# Patient Record
Sex: Male | Born: 1965 | Race: White | Hispanic: No | Marital: Married | Smoking: Former smoker
Health system: Southern US, Community
[De-identification: ages and names within clinical notes are randomized; demographics above are authoritative.]

## PROBLEM LIST (undated history)

## (undated) DIAGNOSIS — E079 Disorder of thyroid, unspecified: Secondary | ICD-10-CM

## (undated) DIAGNOSIS — F419 Anxiety disorder, unspecified: Secondary | ICD-10-CM

## (undated) DIAGNOSIS — K219 Gastro-esophageal reflux disease without esophagitis: Secondary | ICD-10-CM

## (undated) DIAGNOSIS — R5383 Other fatigue: Secondary | ICD-10-CM

## (undated) DIAGNOSIS — F1910 Other psychoactive substance abuse, uncomplicated: Secondary | ICD-10-CM

## (undated) DIAGNOSIS — T7840XA Allergy, unspecified, initial encounter: Secondary | ICD-10-CM

## (undated) HISTORY — DX: Anxiety disorder, unspecified: F41.9

## (undated) HISTORY — DX: Allergy, unspecified, initial encounter: T78.40XA

## (undated) HISTORY — DX: Other psychoactive substance abuse, uncomplicated: F19.10

## (undated) HISTORY — DX: Other fatigue: R53.83

## (undated) HISTORY — DX: Disorder of thyroid, unspecified: E07.9

## (undated) HISTORY — DX: Gastro-esophageal reflux disease without esophagitis: K21.9

---
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
## 2007-10-18 ENCOUNTER — Ambulatory Visit (HOSPITAL_COMMUNITY)
Admission: RE | Admit: 2007-10-18 | Discharge: 2007-10-18 | Payer: Self-pay | Admitting: Physical Medicine and Rehabilitation

## 2010-11-07 ENCOUNTER — Ambulatory Visit (INDEPENDENT_AMBULATORY_CARE_PROVIDER_SITE_OTHER): Payer: 59 | Admitting: General Surgery

## 2010-11-07 ENCOUNTER — Encounter (INDEPENDENT_AMBULATORY_CARE_PROVIDER_SITE_OTHER): Payer: Self-pay | Admitting: General Surgery

## 2010-11-07 VITALS — BP 118/82 | HR 62 | Temp 97.3°F | Ht 75.0 in | Wt 212.1 lb

## 2010-11-07 DIAGNOSIS — K645 Perianal venous thrombosis: Secondary | ICD-10-CM

## 2010-11-07 MED ORDER — DOCUSATE CALCIUM 240 MG PO CAPS: 240.0000 mg | ORAL_CAPSULE | Freq: Two times a day (BID) | ORAL | Status: AC

## 2010-11-07 NOTE — Patient Instructions (Signed)
Take 20-30grams of fiber/day Return to my clinic if symptoms not resolved in 8 weeks

## 2010-11-07 NOTE — Progress Notes (Signed)
Chief Complaint  Patient presents with  . Other    new pt- possible hemorrhoids    HPI Andrew Blair is a 45 y.o. male.  This patient presents today for evaluation of hemorrhoids. He states that he has a history of hemorrhoid flare up approximately 15 years ago which presented with a one to 2 weeks of swelling and bleeding but his symptoms resolved at that time and hasn't had a problem since. Proximally 3 weeks ago, he began having some left-sided rectal swelling and had some associated pain in the area. He treated this with sitz baths, Preparation H, Tucks wipes, and Advil and his pain resolved but he still has bulging and swelling in the area. He denies any bleeding or difficulty with hygiene. He takes occasional over-the-counter stool softeners but states that his bowels are normal a regular "like clockwork". She denies any history of colonoscopy. HPI  Past Medical History  Diagnosis Date  . Thyroid disease     hypothyroid  . GERD (gastroesophageal reflux disease)   . Fatigue     discomfort from hemorrhoids leading to lack of sleep and fatigue     Past Surgical History  Procedure Date  . Skin surgery 1984    skin graph surgery on left hand     Family History  Problem Relation Age of Onset  . Hypertension Mother   . Kidney disease Father     stones    Social History History  Substance Use Topics  . Smoking status: Current Everyday Smoker -- 0.5 packs/day  . Smokeless tobacco: Not on file  . Alcohol Use: 1.5 oz/week    3 drink(s) per week    No Known Allergies  Current Outpatient Prescriptions  Medication Sig Dispense Refill  . Ibuprofen (ADVIL PO) Take 400 mg by mouth daily.        . Levothyroxine Sodium (SYNTHROID PO) Take 0.25 mg by mouth daily.        Marland Kitchen omeprazole (PRILOSEC) 40 MG capsule Take 40 mg by mouth as needed.        . docusate calcium (SURFAK) 240 MG capsule Take 1 capsule (240 mg total) by mouth 2 (two) times daily.  60 capsule  0    Review of  Systems Review of Systems  Constitutional: Positive for malaise/fatigue. Negative for fever and chills.  HENT: Negative.   Eyes: Negative.   Respiratory: Negative.   Cardiovascular: Negative.   Gastrointestinal: Negative.   Genitourinary: Negative.   Musculoskeletal: Negative.   Skin: Negative.   Neurological: Negative.   Endo/Heme/Allergies: Negative.   Psychiatric/Behavioral: Negative.     Blood pressure 118/82, pulse 62, temperature 97.3 F (36.3 C), height 6\' 3"  (1.905 m), weight 212 lb 2 oz (96.219 kg).  Physical Exam Physical Exam  Constitutional: He is oriented to person, place, and time. He appears well-developed and well-nourished. No distress.  HENT:  Head: Normocephalic and atraumatic.  Mouth/Throat: No oropharyngeal exudate.  Eyes: Conjunctivae and EOM are normal. Pupils are equal, round, and reactive to light. Right eye exhibits no discharge. Left eye exhibits no discharge. No scleral icterus.  Neck: Normal range of motion. Neck supple. No tracheal deviation present.  Cardiovascular: Normal rate, regular rhythm and normal heart sounds.   Respiratory: Effort normal and breath sounds normal. No stridor. No respiratory distress. He has no wheezes.  GI: Soft. Bowel sounds are normal. He exhibits no distension and no mass. There is no tenderness. There is no rebound and no guarding.  Genitourinary:  Digital rectal exam reveals no internal lesions. No masses. He has a thrombosed external hemorrhoid at the left lateral column. There is a purple hue consistent with thrombosed external hemorrhoid. He has mild right-sided external hemorrhoids without evidence of thrombosis. No evidence of bleeding. Nontender exam.  Musculoskeletal: Normal range of motion. He exhibits no edema.  Neurological: He is alert and oriented to person, place, and time.  Skin: Skin is warm and dry. No rash noted. He is not diaphoretic. No erythema. No pallor.  Psychiatric: He has a normal mood and  affect. His behavior is normal. Judgment and thought content normal.      Assessment    Thrombosed external hemorrhoids. No evidence of bleeding. I do think that this is thrombosed hemorrhoid and is in the resolving stages. No evidence of internal masses or tumors.    Plan    I discussed with him the management of hemorrhoid disease. I think that conservative management for him would be best given his limited symptoms otherwise. He has had only one other episode of hemorrhoid problems approximately 15 years ago and nothing since. I did recommend increased fiber intake to 20-30 g of fiber per day and increase water intake and continue his conservative management as he is articulate with over-the-counter topical treatments. I also prescribed a stool softener which he can take as needed if any constipation. This should resolve in the next few weeks and I do not think will require any incision and drainage since he is not having any discomfort currently. I recommended that he follow up with me in approximately 6-8 weeks if he still has any symptoms. At the time we will consider formal hemorrhoidectomy if needed.       Lodema Pilot DAVID 11/07/2010, 6:11 PM

## 2011-02-20 DIAGNOSIS — J3089 Other allergic rhinitis: Secondary | ICD-10-CM | POA: Insufficient documentation

## 2011-10-19 ENCOUNTER — Other Ambulatory Visit: Payer: Self-pay | Admitting: Orthopedic Surgery

## 2011-10-19 ENCOUNTER — Ambulatory Visit
Admission: RE | Admit: 2011-10-19 | Discharge: 2011-10-19 | Disposition: A | Discharge status: Home / Self Care | Payer: 59 | Source: Ambulatory Visit | Attending: Orthopedic Surgery | Admitting: Orthopedic Surgery

## 2011-10-19 DIAGNOSIS — R52 Pain, unspecified: Secondary | ICD-10-CM

## 2012-07-02 ENCOUNTER — Ambulatory Visit (INDEPENDENT_AMBULATORY_CARE_PROVIDER_SITE_OTHER): Payer: 59 | Admitting: Family Medicine

## 2012-07-02 VITALS — BP 106/68 | HR 87 | Temp 98.1°F | Resp 16

## 2012-07-02 DIAGNOSIS — R3989 Other symptoms and signs involving the genitourinary system: Secondary | ICD-10-CM

## 2012-07-02 DIAGNOSIS — N390 Urinary tract infection, site not specified: Secondary | ICD-10-CM

## 2012-07-02 DIAGNOSIS — N419 Inflammatory disease of prostate, unspecified: Secondary | ICD-10-CM

## 2012-07-02 DIAGNOSIS — N399 Disorder of urinary system, unspecified: Secondary | ICD-10-CM

## 2012-07-02 LAB — POCT CBC
HCT, POC: 44 % (ref 43.5–53.7)
Hemoglobin: 14.2 g/dL (ref 14.1–18.1)
Lymph, poc: 1.3 (ref 0.6–3.4)
MCH, POC: 30.5 pg (ref 27–31.2)
MCHC: 32.3 g/dL (ref 31.8–35.4)
MCV: 94.4 fL (ref 80–97)
POC Granulocyte: 6.5 (ref 2–6.9)
WBC: 8.2 10*3/uL (ref 4.6–10.2)

## 2012-07-02 LAB — POCT URINALYSIS DIPSTICK
Nitrite, UA: POSITIVE
Protein, UA: 100
Urobilinogen, UA: 0.2
pH, UA: 5.5

## 2012-07-02 LAB — POCT UA - MICROSCOPIC ONLY
Casts, Ur, LPF, POC: NEGATIVE
Crystals, Ur, HPF, POC: NEGATIVE
Yeast, UA: POSITIVE

## 2012-07-02 LAB — PSA: PSA: 2.19 ng/mL (ref <=4.00)

## 2012-07-02 MED ORDER — CIPROFLOXACIN HCL 500 MG PO TABS: 500.0000 mg | ORAL_TABLET | Freq: Two times a day (BID) | ORAL | Status: DC

## 2012-07-02 NOTE — Patient Instructions (Addendum)
Drink plenty of fluids, start antibiotic for possible prostate infection, and recheck in 2 days. Return to the clinic or go to the nearest emergency room if any of your symptoms worsen or new symptoms occur. Your should receive a call or letter about your lab results within the next week to 10 days.   Prostatitis The prostate gland is about the size and shape of a walnut. It is located just below your bladder. It produces one of the components of semen, which is made up of sperm and the fluids that help nourish and transport it out from the testicles. Prostatitis is redness, soreness, and swelling (inflammation) of the prostate gland.  There are 3 types of prostatitis:  Acute bacterial prostatitis This is the least common type of prostatitis. It starts quickly and usually leads to a bladder infection. It can occur at any age.  Chronic bacterial prostatitis This is a persistent bacterial infection in the prostate.It usually develops from repeated acute bacterial prostatitis or acute bacterial prostatitis that was not properly treated. It can occur in men of any age but is most common in middle-aged men whose prostate has begun to enlarge.  Chronic prostatitis chronic pelvic pain syndrome This is the most common type of prostatitis. It is inflammation of the prostate gland that is not caused by a bacterial infection. The cause is unknown. CAUSES The cause of acute and chronic bacterial prostatitis is a bacterial infection. The exact cause of chronic prostatitis and chronic pelvic pain syndrome and asymptomatic inflammatory prostatitis is unknown.  SYMPTOMS  Symptoms can vary depending upon the type of prostatitis that exists. There can also be overlap in symptoms. Possible symptoms for each type of prostatitis are listed below. Acute bacterial prostatitis  Painful urination.  Fever or chills.  Muscle or joint pains.  Low back pain.  Low abdominal pain.  Inability to empty bladder  completely.  Sudden urge to urinate.  Frequent urination.  Difficulty starting urine stream.  Weak urine stream.  Discharge from the urethra.  Dribbling after urination.  Rectal pain.  Pain in the testicles, penis, or tip of the penis.  Pain in the space between the anus and scrotum (perineum).  Problems with sexual function.  Painful ejaculation.  Bloody semen. Chronic bacterial prostatitis  The symptoms are similar to those of acute bacterial prostatitis, but they usually are much less severe. Fever, chills, and muscle and joint pain are not associated with chronic bacterial prostatitis. Chronic prostatitis chronic pelvic pain syndrome  Symptoms typically include a dull ache in the scrotum and the perineum. DIAGNOSIS  In order to diagnose prostatitis, your caregiver will ask about your symptoms. If acute or chronic bacterial prostatitis is suspected, a urine sample will be taken and tested (urinalysis). This is to see if there is bacteria in your urine. If the urinalysis result is negative for bacteria, your caregiver may use a finger to feel your prostate (digital rectal exam). This exam helps your caregiver determine if your prostate is swollen and tender. TREATMENT  Treatment for prostatitis depends on the cause. If a bacterial infection is the cause, it can be treated with antibiotic medicine. In cases of chronic bacterial prostatitis, the use of antibiotics for up to 1 month may be necessary. Your caregiver may instruct you to take sitz baths to help relieve pain. A sitz bath is a bath of hot water in which your hips and buttocks are under water. HOME CARE INSTRUCTIONS   Take all medicines as directed by your  caregiver.  Take sitz baths as directed by your caregiver. SEEK MEDICAL CARE IF:   Your symptoms get worse, not better.  You have a fever. SEEK IMMEDIATE MEDICAL CARE IF:   You have chills.  You feel nauseous or vomit.  You feel lightheaded or  faint.  You are unable to urinate.  You have blood or blood clots in your urine. Document Released: 03/02/2000 Document Revised: 05/28/2011 Document Reviewed: 02/05/2011 [facility] Patient Information 2013 [place], [place].

## 2012-07-02 NOTE — Progress Notes (Signed)
Subjective:    Patient ID: Andrew Blair, male    DOB: [birth year], 47 y.o.   MRN: [id number]  HPI Andrew Blair is a 47 y.o. male Here with primary concern is diffuse aches and pains in back and other areas of soreness where previous injuries (did have rib injuries last year and under crawlspace few days prior). Then noticed urgency to urinate yesterday morning, some increased frequency, small amounts.  More sleep last night.  Able to urinate - just smaller amounts and harder to go. No known prior hx of bladder infections or prostate infections.  No known hx of PSA. No fever, n/v, but feels chills some today. Eating and drinking ok today. More constipated past few days - no urge to defecate.   Review of Systems  Constitutional: Positive for chills. Negative for fever.  Gastrointestinal: Negative for nausea, vomiting, abdominal pain, diarrhea, blood in stool and abdominal distention.  Genitourinary: Positive for dysuria, urgency, frequency, flank pain and difficulty urinating. Negative for hematuria, discharge, penile pain and testicular pain.  Musculoskeletal: Positive for myalgias.  Skin: Negative for rash.       Objective:   Physical Exam  Vitals reviewed. Constitutional: He is oriented to person, place, and time. He appears well-developed and well-nourished.  HENT:  Head: Normocephalic and atraumatic.  Eyes: EOM are normal. Pupils are equal, round, and reactive to light.  Neck: No JVD present. Carotid bruit is not present.  Cardiovascular: Normal rate, regular rhythm and normal heart sounds.   No murmur heard. Pulmonary/Chest: Effort normal and breath sounds normal. He has no rales.  Abdominal: Soft. Bowel sounds are normal. There is no tenderness. There is CVA tenderness (r sided slightlly, with paraspinals ttp minimally.  ). No hernia. Hernia confirmed negative in the right inguinal area and confirmed negative in the left inguinal area.  Genitourinary: Testes normal and  penis normal. Right testis shows no swelling and no tenderness. Left testis shows no swelling and no tenderness.  Musculoskeletal: He exhibits no edema.  Lymphadenopathy:       Right: No inguinal adenopathy present.       Left: No inguinal adenopathy present.  Neurological: He is alert and oriented to person, place, and time.  Skin: Skin is warm and dry.  Psychiatric: He has a normal mood and affect.       Results for orders placed in visit on 07/02/12  POCT URINALYSIS DIPSTICK      Result Value Range   Color, UA amber     Clarity, UA cloudy     Glucose, UA neg     Bilirubin, UA neg     Ketones, UA trace     Spec Grav, UA >=1.030     Blood, UA small     pH, UA 5.5     Protein, UA 100     Urobilinogen, UA 0.2     Nitrite, UA pos     Leukocytes, UA small (1+)    POCT UA - MICROSCOPIC ONLY      Result Value Range   WBC, Ur, HPF, POC 4-24     RBC, urine, microscopic 6-18     Bacteria, U Microscopic 2+     Mucus, UA mod     Epithelial cells, urine per micros 0-1     Crystals, Ur, HPF, POC neg     Casts, Ur, LPF, POC neg     Yeast, UA pos    POCT CBC      Result Value  Range   WBC 8.2  4.6 - 10.2 K/uL   Lymph, poc 1.3  0.6 - 3.4   POC LYMPH PERCENT 15.5  10 - 50 %L   MID (cbc) 0.4  0 - 0.9   POC MID % 5.0  0 - 12 %M   POC Granulocyte 6.5  2 - 6.9   Granulocyte percent 79.5  37 - 80 %G   RBC 4.66 (*) 4.69 - 6.13 M/uL   Hemoglobin 14.2  14.1 - 18.1 g/dL   HCT, POC 16.1  09.6 - 53.7 %   MCV 94.4  80 - 97 fL   MCH, POC 30.5  27 - 31.2 pg   MCHC 32.3  31.8 - 35.4 g/dL   RDW, POC 04.5     Platelet Count, POC 216  142 - 424 K/uL   MPV 9.1  0 - 99.8 fL         Assessment & Plan:  [name] is a 47 y.o. male Urine troubles - Plan: POCT urinalysis dipstick, POCT UA - Microscopic Only, POCT CBC, PSA, Urine culture  UTI (urinary tract infection) - Plan: ciprofloxacin (CIPRO) 500 MG tablet  Prostatitis - Plan: ciprofloxacin (CIPRO) 500 MG tablet  UTI - with  pyuria/hematuria, back pain/myalgias and urinary urgency.  R flank pain - ddx includes pyelo, but more likely prostatitis on hx.  Start cipro 500mg  BID - 10 days rx.  check urine cx, push fluids, rtc precautions.  Prostate exam deferred today as likely prostatitis, but may need to check this at follow up. Recheck in 2 days.   Patient Instructions  Drink plenty of fluids, start antibiotic for possible prostate infection, and recheck in 2 days. Return to the clinic or go to the nearest emergency room if any of your symptoms worsen or new symptoms occur. Your should receive a call or letter about your lab results within the next week to 10 days.   Prostatitis The prostate gland is about the size and shape of a walnut. It is located just below your bladder. It produces one of the components of semen, which is made up of sperm and the fluids that help nourish and transport it out from the testicles. Prostatitis is redness, soreness, and swelling (inflammation) of the prostate gland.  There are 3 types of prostatitis:  Acute bacterial prostatitis This is the least common type of prostatitis. It starts quickly and usually leads to a bladder infection. It can occur at any age.  Chronic bacterial prostatitis This is a persistent bacterial infection in the prostate.It usually develops from repeated acute bacterial prostatitis or acute bacterial prostatitis that was not properly treated. It can occur in men of any age but is most common in middle-aged men whose prostate has begun to enlarge.  Chronic prostatitis chronic pelvic pain syndrome This is the most common type of prostatitis. It is inflammation of the prostate gland that is not caused by a bacterial infection. The cause is unknown. CAUSES The cause of acute and chronic bacterial prostatitis is a bacterial infection. The exact cause of chronic prostatitis and chronic pelvic pain syndrome and asymptomatic inflammatory prostatitis is unknown.  SYMPTOMS    Symptoms can vary depending upon the type of prostatitis that exists. There can also be overlap in symptoms. Possible symptoms for each type of prostatitis are listed below. Acute bacterial prostatitis  Painful urination.  Fever or chills.  Muscle or joint pains.  Low back pain.  Low abdominal pain.  Inability to  empty bladder completely.  Sudden urge to urinate.  Frequent urination.  Difficulty starting urine stream.  Weak urine stream.  Discharge from the urethra.  Dribbling after urination.  Rectal pain.  Pain in the testicles, penis, or tip of the penis.  Pain in the space between the anus and scrotum (perineum).  Problems with sexual function.  Painful ejaculation.  Bloody semen. Chronic bacterial prostatitis  The symptoms are similar to those of acute bacterial prostatitis, but they usually are much less severe. Fever, chills, and muscle and joint pain are not associated with chronic bacterial prostatitis. Chronic prostatitis chronic pelvic pain syndrome  Symptoms typically include a dull ache in the scrotum and the perineum. DIAGNOSIS  In order to diagnose prostatitis, your caregiver will ask about your symptoms. If acute or chronic bacterial prostatitis is suspected, a urine sample will be taken and tested (urinalysis). This is to see if there is bacteria in your urine. If the urinalysis result is negative for bacteria, your caregiver may use a finger to feel your prostate (digital rectal exam). This exam helps your caregiver determine if your prostate is swollen and tender. TREATMENT  Treatment for prostatitis depends on the cause. If a bacterial infection is the cause, it can be treated with antibiotic medicine. In cases of chronic bacterial prostatitis, the use of antibiotics for up to 1 month may be necessary. Your caregiver may instruct you to take sitz baths to help relieve pain. A sitz bath is a bath of hot water in which your hips and buttocks are  under water. HOME CARE INSTRUCTIONS   Take all medicines as directed by your caregiver.  Take sitz baths as directed by your caregiver. SEEK MEDICAL CARE IF:   Your symptoms get worse, not better.  You have a fever. SEEK IMMEDIATE MEDICAL CARE IF:   You have chills.  You feel nauseous or vomit.  You feel lightheaded or faint.  You are unable to urinate.  You have blood or blood clots in your urine. Document Released: 03/02/2000 Document Revised: 05/28/2011 Document Reviewed: 02/05/2011 [facility] Patient Information 2013 [place], [place].

## 2012-07-04 LAB — URINE CULTURE

## 2012-12-15 ENCOUNTER — Encounter: Payer: Self-pay | Admitting: Family Medicine

## 2012-12-15 ENCOUNTER — Ambulatory Visit (INDEPENDENT_AMBULATORY_CARE_PROVIDER_SITE_OTHER): Payer: 59 | Admitting: Family Medicine

## 2012-12-15 VITALS — BP 118/80 | HR 71 | Temp 97.8°F | Resp 16 | Ht 73.0 in | Wt 212.0 lb

## 2012-12-15 DIAGNOSIS — K219 Gastro-esophageal reflux disease without esophagitis: Secondary | ICD-10-CM

## 2012-12-15 DIAGNOSIS — H9313 Tinnitus, bilateral: Secondary | ICD-10-CM

## 2012-12-15 DIAGNOSIS — H9319 Tinnitus, unspecified ear: Secondary | ICD-10-CM

## 2012-12-15 DIAGNOSIS — L290 Pruritus ani: Secondary | ICD-10-CM

## 2012-12-15 DIAGNOSIS — Z87440 Personal history of urinary (tract) infections: Secondary | ICD-10-CM

## 2012-12-15 DIAGNOSIS — K220 Achalasia of cardia: Secondary | ICD-10-CM

## 2012-12-15 DIAGNOSIS — B070 Plantar wart: Secondary | ICD-10-CM

## 2012-12-15 DIAGNOSIS — Z0000 Encounter for general adult medical examination without abnormal findings: Secondary | ICD-10-CM

## 2012-12-15 DIAGNOSIS — Z23 Encounter for immunization: Secondary | ICD-10-CM

## 2012-12-15 LAB — POCT URINALYSIS DIPSTICK
Bilirubin, UA: NEGATIVE
Leukocytes, UA: NEGATIVE
Nitrite, UA: NEGATIVE
Protein, UA: NEGATIVE
pH, UA: 5.5

## 2012-12-15 LAB — CBC WITH DIFFERENTIAL/PLATELET
Hemoglobin: 14.3 g/dL (ref 13.0–17.0)
Lymphocytes Relative: 28 % (ref 12–46)
Lymphs Abs: 1.6 10*3/uL (ref 0.7–4.0)
Monocytes Relative: 6 % (ref 3–12)
Neutro Abs: 3.4 10*3/uL (ref 1.7–7.7)
Neutrophils Relative %: 61 % (ref 43–77)
RBC: 4.71 MIL/uL (ref 4.22–5.81)

## 2012-12-15 LAB — COMPREHENSIVE METABOLIC PANEL
Albumin: 4.9 g/dL (ref 3.5–5.2)
CO2: 28 mEq/L (ref 19–32)
Glucose, Bld: 87 mg/dL (ref 70–99)
Potassium: 3.8 mEq/L (ref 3.5–5.3)
Sodium: 133 mEq/L — ABNORMAL LOW (ref 135–145)
Total Protein: 7.4 g/dL (ref 6.0–8.3)

## 2012-12-15 LAB — LIPID PANEL
Cholesterol: 236 mg/dL — ABNORMAL HIGH (ref 0–200)
LDL Cholesterol: 130 mg/dL — ABNORMAL HIGH (ref 0–99)
Triglycerides: 226 mg/dL — ABNORMAL HIGH (ref <150)

## 2012-12-15 LAB — POCT UA - MICROSCOPIC ONLY
Bacteria, U Microscopic: NEGATIVE
Casts, Ur, LPF, POC: NEGATIVE
Yeast, UA: NEGATIVE

## 2012-12-15 NOTE — Progress Notes (Signed)
Subjective:    Patient ID: Andrew Blair, male    DOB: 21-Dec-1965, 47 y.o.   MRN: [id number]  HPI Andrew Blair is a 47 y.o. male  Here for annual exam/physical. Other concerns as per ROS and below. Fasting for 8 hrs.   Last seen 07/02/12 for UTI/suspected prostatitis. Urine culture positive for E coli, sensitive to Cipro as prescribed, and PSA 2.19.  Has not had follow up U/A or PSA.   Episodic anal itching after taking Cipro - notes in bed at times, resolves then returns. Persistent. Tried otc preparation H - no relief.   Dentist: Dr. Lyda Jester - 1 month ago.  Optho: progressive lenses and exam in Spring 2014.  Flu: never taken, as never had flu. Refused today.  Tetanus: over 10 years. Will have today.    Hypothyroidism -synthroid 150mg  QD.  followed by Dr. Sharl Ma. Followed quarterly.   Episode of food getting stuck in November 2012. Ever since - feeling like solid foods - meats, bread feel stuck  if not chewing well. Is able to clear on own.  Belching frequently. Has episodic heartburn - otc omeprazole - as needed. Few times per month, prior QD, improved with diet changes. Last endoscopy about 4 years ago - unknown GI then. No vomiting.   Painful hard spot on L heel for few months. NKI.  Calloused area. Able to walk ok, but sore with weight on it.   Few moles in front and behind ear. No recent changes. No FH of skin cancer. No recent derm eval.    FH: throat cancer or lymphoma in maternal GM. Paternal uncles with MI/CAD - earliest at 76yo. Dad with PTCA at 65yo. No MI.   Past Medical History  Diagnosis Date  . Thyroid disease     hypothyroid  . GERD (gastroesophageal reflux disease)   . Fatigue     discomfort from hemorrhoids leading to lack of sleep and fatigue   . Allergy   . Anxiety   . Substance abuse    Past Surgical History  Procedure Laterality Date  . Skin surgery  1984    skin graph surgery on left hand   . Hand surgery  1984   No Known Allergies Prior  to Admission medications   Medication Sig Start Date End Date Taking? Authorizing Provider  levothyroxine (SYNTHROID, LEVOTHROID) 100 MCG tablet Take 100 mcg by mouth daily.   Yes Historical Provider, MD  ciprofloxacin (CIPRO) 500 MG tablet Take 1 tablet (500 mg total) by mouth 2 (two) times daily. 07/02/12   Shade Flood, MD   History   Social History  . Marital Status: Married    Spouse Name: N/A    Number of Children: N/A  . Years of Education: N/A   Occupational History  . Not on file.   Social History Main Topics  . Smoking status: Current Every Day Smoker -- 0.50 packs/day  . Smokeless tobacco: Not on file  . Alcohol Use: 1.5 oz/week    3 drink(s) per week  . Drug Use: No  . Sexual Activity: Not on file   Other Topics Concern  . Not on file   Social History Narrative   E_Cigarette      Review of Systems 13 point review of systems per patient health survey noted.  Negative other than as indicated on reviewed nursing note.   Chronic L >R tinnitus, since playing bass in band for years. No recent changes.  Hx of neck pain, hand  dysesthesias with DDD in neck, s/p therapy, no surgery. Pain comes and goes at times. Less if remaining active.       Objective:   Physical Exam  Vitals reviewed. Constitutional: He is oriented to person, place, and time. He appears well-developed and well-nourished.  HENT:  Head: Normocephalic and atraumatic.    Right Ear: External ear normal.  Left Ear: External ear normal.  Mouth/Throat: Oropharynx is clear and moist.  Eyes: Conjunctivae and EOM are normal. Pupils are equal, round, and reactive to light.  Neck: Normal range of motion. Neck supple. No thyromegaly present.  Cardiovascular: Normal rate, regular rhythm, normal heart sounds and intact distal pulses.   Pulmonary/Chest: Effort normal and breath sounds normal. No respiratory distress. He has no wheezes.  Abdominal: Soft. He exhibits no distension. There is no tenderness.  Hernia confirmed negative in the right inguinal area and confirmed negative in the left inguinal area.  Genitourinary: Prostate normal.  R perianal area slightly excoriated.   Musculoskeletal: Normal range of motion. He exhibits no edema and no tenderness.  Lymphadenopathy:    He has no cervical adenopathy.  Neurological: He is alert and oriented to person, place, and time. He has normal reflexes.  Skin: Skin is warm and dry.  Psychiatric: He has a normal mood and affect. His behavior is normal.    EKG: sinus bradycardia, nonspecific twaves V1-2. No prior available for comparison.  Results for orders placed in visit on 12/15/12  POCT URINALYSIS DIPSTICK      Result Value Range   Color, UA yellow     Clarity, UA clear     Glucose, UA neg     Bilirubin, UA neg     Ketones, UA neg     Spec Grav, UA >=1.030     Blood, UA neg     pH, UA 5.5     Protein, UA neg     Urobilinogen, UA 0.2     Nitrite, UA neg     Leukocytes, UA Negative    POCT UA - MICROSCOPIC ONLY      Result Value Range   WBC, Ur, HPF, POC 0-1     RBC, urine, microscopic 0-2     Bacteria, U Microscopic neg     Mucus, UA trace     Epithelial cells, urine per micros 0-1     Crystals, Ur, HPF, POC neg     Casts, Ur, LPF, POC neg     Yeast, UA neg         Assessment & Plan:  Andrew Blair is a 47 y.o. male Routine general medical examination at a health care facility - Plan: POCT urinalysis dipstick, POCT UA - Microscopic Only, CBC with Differential, Comprehensive metabolic panel, Lipid panel, PSA, Tdap vaccine greater than or equal to 7yo IM, EKG 12-Lead  GERD (gastroesophageal reflux disease) - Plan: Ambulatory referral to Gastroenterology  Achalasia - Plan: Ambulatory referral to Gastroenterology  Hx: UTI (urinary tract infection)  Plantar wart  Tinnitus of both ears  Anal itching  Annual Exam  - anticipatory guidance below, labs as above. Declined flu vaccination. Tdap given.   Plantar wart - L  heel - try otc wart remover, corn/callous pad, then recheck if not improving in next 1-2 months  Preauricular folliculitis vs. Ingrown hairs on face.  No suspicious nevi, but if not resolving - recheck for eval.   Hypothyroidism - followed by endocrine.   Pruritus ani - doubt from prior abx's.  H/o  as below. Doubt pinworms as no other members of family with sx's, but consider tx if not improving.   GERD, with intermittent achalasia sx's. - refer to GI for eval, possible EGD, restart otc omeprazole QD in the meantime.    Tinnitus - chronic/longstanding. Some subjective hearing loss - discussed ENT eval, declined at present.   Intermittent neck pain, hx of DDD - stable currently, discussed to rtc to eval further if recurs/flairs.    Patient Instructions  Omeprazole every day for now, we will refer you to gastroenterologist to discuss this further. Try compound W over the counter for foot wart, recheck in next month if not improving.  Also return in next month if tips below do not help anal itching. The bumps on the face to appear to be ingrown hairs. If more redness, discharge or not resolving - return for recheck.  Let me know if you would like to be evaluated with hearing test or Ear nose and Throat physician for the ringing in the ears.  You should receive a call or letter about your lab results within the next week to 10 days.   Return to the clinic or go to the nearest emergency room if any of your symptoms worsen or new symptoms occur.   Anal Itching Itching around the anus is a common problem. It is usually not dangerous. It often is caused by skin irritation from stool, moisture, soaps, or clothing. Other causes are pinworms, especially if the itching is worse at night. In adults, the itching may be due to hemorrhoids. In some cases, the cause is unknown. Itching usually can be controlled by keeping the anal area clean and dry. CAUSES   Loose or sticky stool from diarrhea or rectal  leakage.   Hemorrhoids. They allow stool to stick to the rectal area.   Certain foods. Be sure to discuss your diet with your caregiver.   Dry skin or skin diseases.   Infections such as a local yeast infection or certain sexually transmitted diseases (STDs).   Worms (parasites).   Diseases of the anus. These include abscesses, fissures, fistulas or cancer.   Sometimes a cause cannot be found.  DIAGNOSIS   Your caregiver will take your history and examine you. A careful exam of the anus is important. Your caregiver will inspect the outer area of your anus and will do a rectal exam.   Sometimes your caregiver will need to look inside the anus. This is a simple procedure that may be a little uncomfortable but usually does not require anesthesia.   If abnormalities are found, a biopsy might be done or you may be referred to a specialist.  TREATMENT  The treatment of your condition will depend on the cause.   Your caregiver will advise you on treatment of any disease found.   If you have rectal leakage or loose stools, a diet high in fiber or a fiber supplement should improve your condition.   Avoid foods or substances that might be causing your itching.   Gentle care of your anal area is important to avoid worsening the irritation.  HOME CARE INSTRUCTIONS  Do not rub or scratch the area. This makes the itching worse. It could worsen conditions such as parasite infections.   After every bowel movement and at bedtime, gently clean the anal area. Bathe or use moistened tissue or soft wash cloth. You also may use pre-moistened anal cleansing pads or tissues made for cleaning up babies. Do not use  soap. Gently pat the area dry.   Wear underwear made of cotton or with a cotton crotch. Do not wear tight fitting clothes or underwear that keep moisture in.   Avoid foods and beverages that may cause anal itching. Examples are beer, tea, coffee, milk, cola, tomatoes, citrus fruits, nuts,  chocolate, and spicy foods.   Be sure you have enough fiber in your diet.   Do not use products that may irritate the anal skin. These include perfumed or colored toilet paper, deodorant sprays, and perfumed soaps.   Do not use any medication on the anal area unless advised. Some products may make itching worse.   It may take a few weeks for things to fully improve.  SEEK MEDICAL CARE IF:   The itching is not better in 3 to 4 days or is getting worse.   The skin around the anus becomes red or tender. This may be a sign of infection.   You have pain in the anus, especially with bowel movement.  SEEK IMMEDIATE MEDICAL CARE IF:  You have increasing pain in the anus or in the abdomen.   You have blood coming from the anus.   You have pus or other discharge from the anus.   You develop a fever.  Document Released: 03/02/2000 Document Revised: 02/22/2011 Document Reviewed: 03/09/2008 [facility] Patient Information 2012 [place], [place].   Plantar Wart Warts are benign (noncancerous) growths of the outer skin layer. They can occur at any time in life but are most common during childhood and the teen years. Warts can occur on many skin surfaces of the body. When they occur on the underside (sole) of your foot they are called plantar warts. They often emerge in groups with several small warts encircling a larger growth. CAUSES  Human papillomavirus (HPV) is the cause of plantar warts. HPV attacks a break in the skin of the foot. Walking barefoot can lead to exposure to the wart virus. Plantar warts tend to develop over areas of pressure such as the heel and ball of the foot. Plantar warts often grow into the deeper layers of skin. They may spread to other areas of the sole but cannot spread to other areas of the body. SYMPTOMS  You may also notice a growth on the undersurface of your foot. The wart may grow directly into the sole of the foot, or rise above the surface of the skin on the sole of  the foot, or both. They are most often flat from pressure. Warts generally do not cause itching but may cause pain in the area of the wart when you put weight on your foot. DIAGNOSIS  Diagnosis is made by physical examination. This means your caregiver discovers it while examining your foot.  TREATMENT  There are many ways to treat plantar warts. However, warts are very tough. Sometimes it is difficult to treat them so that they go away completely and do not grow back. Any treatment must be done regularly to work. If left untreated, most plantar warts will eventually disappear over a period of one to two years. Treatments you can do at home include:  Putting duct tape over the top of the wart (occlusion), has been found to be effective over several months. The duct tape should be removed each night and reapplied until the wart has disappeared.  Placing over-the-counter medications on top of the wart to help kill the wart virus and remove the wart tissue (salicylic acid, cantharidin, and dichloroacetic acid )  are useful. These are called keratolytic agents. These medications make the skin soft and gradually layers will shed away. Theses compounds are usually placed on the wart each night and then covered with a band-aid. They are also available in pre-medicated band-aid form. Avoid surrounding skin when applying these liquids as these medications can burn healthy skin. The treatment may take several months of nightly use to be effective.  Cryotherapy to freeze the wart has recently become available over-the-counter for children 4 years and older. This system makes use of a soft narrow applicator connected to a bottle of compressed cold liquid that is applied directly to the wart. This medication can burn health skin and should be used with caution.  As with all over-the-counter medications, read the directions carefully before use. Treatments generally done in your caregiver's office include:  Some  aggressive treatments may cause discomfort, discoloration and scaring of the surrounding skin. The risks and benefits of treatment should be discussed with your caregiver.  Freezing the wart with liquid nitrogen (cryotherapy, see above).  Burning the wart with use of very high heat (cautery).  Injecting medication into the wart.  Surgically removing or laser treatment of the wart.  Your caregiver may refer you to a dermatologist for difficult to treat, large sized or large numbers of warts. HOME CARE INSTRUCTIONS   Soak the affected area in warm water. Dry the area completely when you are done. Remove the top layer of softened skin, then apply the chosen topical medication and reapply a bandage.  Remove the bandage daily and file excess wart tissue (pumice stone works well for this purpose). Repeat the entire process daily or every other day for weeks until the plantar wart disappears.  Several brands of salicylic acid pads are available as over-the-counter remedies.  Pain can be relieved by wearing a doughnut bandage. This is a bandage with a hole in it. The bandage is put on with the hole over the wart. This helps take the pressure off the wart and gives pain relief. To help prevent plantar warts:  Wear shoes and socks and change them daily.  Keep feet clean and dry.  Check your feet and your children's feet regularly.  Avoid direct contact with warts on other people.  Have growths, or changes on your skin checked by your caregiver. Document Released: 05/26/2003 Document Revised: 05/28/2011 Document Reviewed: 11/03/2008 [facility] Patient Information 2014 [place], [place].     Keeping you healthy  Get these tests  Blood pressure- Have your blood pressure checked once a year by your healthcare provider.  Normal blood pressure is 120/80.  Weight- Have your body mass index (BMI) calculated to screen for obesity.  BMI is a measure of body fat based on height and weight. You can  also calculate your own BMI at https://www.west-esparza.com/.  Cholesterol- Have your cholesterol checked regularly starting at age 64, sooner may be necessary if you have diabetes, high blood pressure, if a family member developed heart diseases at an early age or if you smoke.   Chlamydia, HIV, and other sexual transmitted disease- Get screened each year until the age of 83 then within three months of each new sexual partner.  Diabetes- Have your blood sugar checked regularly if you have high blood pressure, high cholesterol, a family history of diabetes or if you are overweight.  Get these vaccines  Flu shot- Every fall.  Tetanus shot- Every 10 years.  Menactra- Single dose; prevents meningitis.  Take these steps  Don't smoke-  If you do smoke, ask your healthcare provider about quitting. For tips on how to quit, go to www.smokefree.gov or call 1-800-QUIT-NOW.  Be physically active- Exercise 5 days a week for at least 30 minutes.  If you are not already physically active start slow and gradually work up to 30 minutes of moderate physical activity.  Examples of moderate activity include walking briskly, mowing the yard, dancing, swimming bicycling, etc.  Eat a healthy diet- Eat a variety of healthy foods such as fruits, vegetables, low fat milk, low fat cheese, yogurt, lean meats, poultry, fish, beans, tofu, etc.  For more information on healthy eating, go to www.thenutritionsource.org  Drink alcohol in moderation- Limit alcohol intake two drinks or less a day.  Never drink and drive.  Dentist- Brush and floss teeth twice daily; visit your dentis twice a year.  Depression-Your emotional health is as important as your physical health.  If you're feeling down, losing interest in things you normally enjoy please talk with your healthcare provider.  Gun Safety- If you keep a gun in your home, keep it unloaded and with the safety lock on.  Bullets should be stored separately.  Helmet use-  Always wear a helmet when riding a motorcycle, bicycle, rollerblading or skateboarding.  Safe sex- If you may be exposed to a sexually transmitted infection, use a condom  Seat belts- Seat bels can save your life; always wear one.  Smoke/Carbon Monoxide detectors- These detectors need to be installed on the appropriate level of your home.  Replace batteries at least once a year.  Skin Cancer- When out in the sun, cover up and use sunscreen SPF 15 or higher.  Violence- If anyone is threatening or hurting you, please tell your healthcare provider.

## 2012-12-15 NOTE — Progress Notes (Signed)
  Subjective:    Patient ID: Andrew Blair, male    DOB: [birth year], 47 y.o.   MRN: [id number]  HPI    Review of Systems  Constitutional: Negative.   HENT: Positive for trouble swallowing, neck pain, neck stiffness and tinnitus.   Eyes: Negative.   Respiratory: Negative.   Cardiovascular: Negative.   Gastrointestinal: Negative.   Endocrine: Negative.   Genitourinary: Negative.   Skin: Negative.   Allergic/Immunologic: Negative.   Neurological: Negative.   Hematological: Negative.   Psychiatric/Behavioral: Positive for sleep disturbance.       Objective:   Physical Exam        Assessment & Plan:

## 2012-12-15 NOTE — Patient Instructions (Addendum)
Omeprazole every day for now, we will refer you to gastroenterologist to discuss this further. Try compound W over the counter for foot wart, recheck in next month if not improving.  Also return in next month if tips below do not help anal itching. The bumps on the face to appear to be ingrown hairs. If more redness, discharge or not resolving - return for recheck.  Let me know if you would like to be evaluated with hearing test or Ear nose and Throat physician for the ringing in the ears.  You should receive a call or letter about your lab results within the next week to 10 days.   Return to the clinic or go to the nearest emergency room if any of your symptoms worsen or new symptoms occur.   Anal Itching Itching around the anus is a common problem. It is usually not dangerous. It often is caused by skin irritation from stool, moisture, soaps, or clothing. Other causes are pinworms, especially if the itching is worse at night. In adults, the itching may be due to hemorrhoids. In some cases, the cause is unknown. Itching usually can be controlled by keeping the anal area clean and dry. CAUSES   Loose or sticky stool from diarrhea or rectal leakage.   Hemorrhoids. They allow stool to stick to the rectal area.   Certain foods. Be sure to discuss your diet with your caregiver.   Dry skin or skin diseases.   Infections such as a local yeast infection or certain sexually transmitted diseases (STDs).   Worms (parasites).   Diseases of the anus. These include abscesses, fissures, fistulas or cancer.   Sometimes a cause cannot be found.  DIAGNOSIS   Your caregiver will take your history and examine you. A careful exam of the anus is important. Your caregiver will inspect the outer area of your anus and will do a rectal exam.   Sometimes your caregiver will need to look inside the anus. This is a simple procedure that may be a little uncomfortable but usually does not require anesthesia.    If abnormalities are found, a biopsy might be done or you may be referred to a specialist.  TREATMENT  The treatment of your condition will depend on the cause.   Your caregiver will advise you on treatment of any disease found.   If you have rectal leakage or loose stools, a diet high in fiber or a fiber supplement should improve your condition.   Avoid foods or substances that might be causing your itching.   Gentle care of your anal area is important to avoid worsening the irritation.  HOME CARE INSTRUCTIONS  Do not rub or scratch the area. This makes the itching worse. It could worsen conditions such as parasite infections.   After every bowel movement and at bedtime, gently clean the anal area. Bathe or use moistened tissue or soft wash cloth. You also may use pre-moistened anal cleansing pads or tissues made for cleaning up babies. Do not use soap. Gently pat the area dry.   Wear underwear made of cotton or with a cotton crotch. Do not wear tight fitting clothes or underwear that keep moisture in.   Avoid foods and beverages that may cause anal itching. Examples are beer, tea, coffee, milk, cola, tomatoes, citrus fruits, nuts, chocolate, and spicy foods.   Be sure you have enough fiber in your diet.   Do not use products that may irritate the anal skin. These include perfumed  or colored toilet paper, deodorant sprays, and perfumed soaps.   Do not use any medication on the anal area unless advised. Some products may make itching worse.   It may take a few weeks for things to fully improve.  SEEK MEDICAL CARE IF:   The itching is not better in 3 to 4 days or is getting worse.   The skin around the anus becomes red or tender. This may be a sign of infection.   You have pain in the anus, especially with bowel movement.  SEEK IMMEDIATE MEDICAL CARE IF:  You have increasing pain in the anus or in the abdomen.   You have blood coming from the anus.   You have pus or other  discharge from the anus.   You develop a fever.  Document Released: 03/02/2000 Document Revised: 02/22/2011 Document Reviewed: 03/09/2008 [facility] Patient Information 2012 [place], [place].   Plantar Wart Warts are benign (noncancerous) growths of the outer skin layer. They can occur at any time in life but are most common during childhood and the teen years. Warts can occur on many skin surfaces of the body. When they occur on the underside (sole) of your foot they are called plantar warts. They often emerge in groups with several small warts encircling a larger growth. CAUSES  Human papillomavirus (HPV) is the cause of plantar warts. HPV attacks a break in the skin of the foot. Walking barefoot can lead to exposure to the wart virus. Plantar warts tend to develop over areas of pressure such as the heel and ball of the foot. Plantar warts often grow into the deeper layers of skin. They may spread to other areas of the sole but cannot spread to other areas of the body. SYMPTOMS  You may also notice a growth on the undersurface of your foot. The wart may grow directly into the sole of the foot, or rise above the surface of the skin on the sole of the foot, or both. They are most often flat from pressure. Warts generally do not cause itching but may cause pain in the area of the wart when you put weight on your foot. DIAGNOSIS  Diagnosis is made by physical examination. This means your caregiver discovers it while examining your foot.  TREATMENT  There are many ways to treat plantar warts. However, warts are very tough. Sometimes it is difficult to treat them so that they go away completely and do not grow back. Any treatment must be done regularly to work. If left untreated, most plantar warts will eventually disappear over a period of one to two years. Treatments you can do at home include:  Putting duct tape over the top of the wart (occlusion), has been found to be effective over several months.  The duct tape should be removed each night and reapplied until the wart has disappeared.  Placing over-the-counter medications on top of the wart to help kill the wart virus and remove the wart tissue (salicylic acid, cantharidin, and dichloroacetic acid ) are useful. These are called keratolytic agents. These medications make the skin soft and gradually layers will shed away. Theses compounds are usually placed on the wart each night and then covered with a band-aid. They are also available in pre-medicated band-aid form. Avoid surrounding skin when applying these liquids as these medications can burn healthy skin. The treatment may take several months of nightly use to be effective.  Cryotherapy to freeze the wart has recently become available over-the-counter for children  4 years and older. This system makes use of a soft narrow applicator connected to a bottle of compressed cold liquid that is applied directly to the wart. This medication can burn health skin and should be used with caution.  As with all over-the-counter medications, read the directions carefully before use. Treatments generally done in your caregiver's office include:  Some aggressive treatments may cause discomfort, discoloration and scaring of the surrounding skin. The risks and benefits of treatment should be discussed with your caregiver.  Freezing the wart with liquid nitrogen (cryotherapy, see above).  Burning the wart with use of very high heat (cautery).  Injecting medication into the wart.  Surgically removing or laser treatment of the wart.  Your caregiver may refer you to a dermatologist for difficult to treat, large sized or large numbers of warts. HOME CARE INSTRUCTIONS   Soak the affected area in warm water. Dry the area completely when you are done. Remove the top layer of softened skin, then apply the chosen topical medication and reapply a bandage.  Remove the bandage daily and file excess wart tissue  (pumice stone works well for this purpose). Repeat the entire process daily or every other day for weeks until the plantar wart disappears.  Several brands of salicylic acid pads are available as over-the-counter remedies.  Pain can be relieved by wearing a doughnut bandage. This is a bandage with a hole in it. The bandage is put on with the hole over the wart. This helps take the pressure off the wart and gives pain relief. To help prevent plantar warts:  Wear shoes and socks and change them daily.  Keep feet clean and dry.  Check your feet and your children's feet regularly.  Avoid direct contact with warts on other people.  Have growths, or changes on your skin checked by your caregiver. Document Released: 05/26/2003 Document Revised: 05/28/2011 Document Reviewed: 11/03/2008 [facility] Patient Information 2014 [place], [place].     Keeping you healthy  Get these tests  Blood pressure- Have your blood pressure checked once a year by your healthcare provider.  Normal blood pressure is 120/80.  Weight- Have your body mass index (BMI) calculated to screen for obesity.  BMI is a measure of body fat based on height and weight. You can also calculate your own BMI at https://www.west-esparza.com/.  Cholesterol- Have your cholesterol checked regularly starting at age 95, sooner may be necessary if you have diabetes, high blood pressure, if a family member developed heart diseases at an early age or if you smoke.   Chlamydia, HIV, and other sexual transmitted disease- Get screened each year until the age of 70 then within three months of each new sexual partner.  Diabetes- Have your blood sugar checked regularly if you have high blood pressure, high cholesterol, a family history of diabetes or if you are overweight.  Get these vaccines  Flu shot- Every fall.  Tetanus shot- Every 10 years.  Menactra- Single dose; prevents meningitis.  Take these steps  Don't smoke- If you do smoke, ask  your healthcare provider about quitting. For tips on how to quit, go to www.smokefree.gov or call 1-800-QUIT-NOW.  Be physically active- Exercise 5 days a week for at least 30 minutes.  If you are not already physically active start slow and gradually work up to 30 minutes of moderate physical activity.  Examples of moderate activity include walking briskly, mowing the yard, dancing, swimming bicycling, etc.  Eat a healthy diet- Eat a variety of healthy  foods such as fruits, vegetables, low fat milk, low fat cheese, yogurt, lean meats, poultry, fish, beans, tofu, etc.  For more information on healthy eating, go to www.thenutritionsource.org  Drink alcohol in moderation- Limit alcohol intake two drinks or less a day.  Never drink and drive.  Dentist- Brush and floss teeth twice daily; visit your dentis twice a year.  Depression-Your emotional health is as important as your physical health.  If you're feeling down, losing interest in things you normally enjoy please talk with your healthcare provider.  Gun Safety- If you keep a gun in your home, keep it unloaded and with the safety lock on.  Bullets should be stored separately.  Helmet use- Always wear a helmet when riding a motorcycle, bicycle, rollerblading or skateboarding.  Safe sex- If you may be exposed to a sexually transmitted infection, use a condom  Seat belts- Seat bels can save your life; always wear one.  Smoke/Carbon Monoxide detectors- These detectors need to be installed on the appropriate level of your home.  Replace batteries at least once a year.  Skin Cancer- When out in the sun, cover up and use sunscreen SPF 15 or higher.  Violence- If anyone is threatening or hurting you, please tell your healthcare provider.

## 2012-12-25 ENCOUNTER — Other Ambulatory Visit: Payer: Self-pay | Admitting: Family Medicine

## 2012-12-25 DIAGNOSIS — E871 Hypo-osmolality and hyponatremia: Secondary | ICD-10-CM

## 2012-12-26 ENCOUNTER — Encounter: Payer: Self-pay | Admitting: Family Medicine

## 2013-06-18 ENCOUNTER — Other Ambulatory Visit: Payer: Self-pay | Admitting: Internal Medicine

## 2013-06-18 DIAGNOSIS — Z8349 Family history of other endocrine, nutritional and metabolic diseases: Secondary | ICD-10-CM

## 2013-06-18 DIAGNOSIS — E039 Hypothyroidism, unspecified: Secondary | ICD-10-CM

## 2013-06-19 ENCOUNTER — Ambulatory Visit
Admission: RE | Admit: 2013-06-19 | Discharge: 2013-06-19 | Disposition: A | Discharge status: Home / Self Care | Payer: 59 | Source: Ambulatory Visit | Attending: Internal Medicine | Admitting: Internal Medicine

## 2013-06-19 DIAGNOSIS — Z8349 Family history of other endocrine, nutritional and metabolic diseases: Secondary | ICD-10-CM

## 2013-06-19 DIAGNOSIS — E039 Hypothyroidism, unspecified: Secondary | ICD-10-CM

## 2014-02-16 ENCOUNTER — Ambulatory Visit (INDEPENDENT_AMBULATORY_CARE_PROVIDER_SITE_OTHER): Payer: 59 | Admitting: Family Medicine

## 2014-02-16 ENCOUNTER — Ambulatory Visit (INDEPENDENT_AMBULATORY_CARE_PROVIDER_SITE_OTHER): Payer: 59

## 2014-02-16 VITALS — BP 104/70 | HR 82 | Temp 98.0°F | Resp 16 | Ht 74.5 in | Wt 220.6 lb

## 2014-02-16 DIAGNOSIS — R079 Chest pain, unspecified: Secondary | ICD-10-CM

## 2014-02-16 DIAGNOSIS — M2550 Pain in unspecified joint: Secondary | ICD-10-CM

## 2014-02-16 DIAGNOSIS — Z8249 Family history of ischemic heart disease and other diseases of the circulatory system: Secondary | ICD-10-CM

## 2014-02-16 DIAGNOSIS — M7750 Other enthesopathy of unspecified foot: Secondary | ICD-10-CM

## 2014-02-16 DIAGNOSIS — K219 Gastro-esophageal reflux disease without esophagitis: Secondary | ICD-10-CM

## 2014-02-16 DIAGNOSIS — R0789 Other chest pain: Secondary | ICD-10-CM

## 2014-02-16 LAB — POCT CBC
Granulocyte percent: 2.2 %G — AB (ref 37–80)
HCT, POC: 46.4 % (ref 43.5–53.7)
Hemoglobin: 15.2 g/dL (ref 14.1–18.1)
Lymph, poc: 1.8 (ref 0.6–3.4)
MCH: 30.4 pg (ref 27–31.2)
MCHC: 32.8 g/dL (ref 31.8–35.4)
MCV: 92.6 fL (ref 80–97)
MID (CBC): 0.1 (ref 0–0.9)
MPV: 7.4 fL (ref 0–99.8)
PLATELET COUNT, POC: 212 10*3/uL (ref 142–424)
POC GRANULOCYTE: 3.7 (ref 2–6.9)
POC LYMPH PERCENT: 32.3 %L (ref 10–50)
POC MID %: 2.2 % (ref 0–12)
RBC: 5.01 M/uL (ref 4.69–6.13)
RDW, POC: 13.5 %
WBC: 5.6 10*3/uL (ref 4.6–10.2)

## 2014-02-16 LAB — D-DIMER, QUANTITATIVE: D-Dimer, Quant: 0.27 ug/mL-FEU (ref 0.00–0.48)

## 2014-02-16 LAB — TROPONIN I: Troponin I: <0.01 ng/mL (ref <0.06)

## 2014-02-16 LAB — POCT SEDIMENTATION RATE: POCT SED RATE: 23 mm/hr — AB (ref 0–22)

## 2014-02-16 NOTE — Patient Instructions (Addendum)
2 tests will be back later today. It is less likely this pain is heart related as not experiencing this with exertion, but if not improving with restarting omeprazole for heartburn, can refer you to cardiologist to discuss this further. If any worsening - go to emergency room or 911. IF you feel that stress is affecting you more, return to discuss this further.   Tylenol for aches in joints. Other information below on heel pain. This appears to be a type of bursitis at the heel, but can be some inflammation on achilles tendon as well.   Chest Pain (Nonspecific) It is often hard to give a specific diagnosis for the cause of chest pain. There is always a chance that your pain could be related to something serious, such as a heart attack or a blood clot in the lungs. You need to follow up with your health care provider for further evaluation. CAUSES   Heartburn.  Pneumonia or bronchitis.  Anxiety or stress.  Inflammation around your heart (pericarditis) or lung (pleuritis or pleurisy).  A blood clot in the lung.  A collapsed lung (pneumothorax). It can develop suddenly on its own (spontaneous pneumothorax) or from trauma to the chest.  Shingles infection (herpes zoster virus). The chest wall is composed of bones, muscles, and cartilage. Any of these can be the source of the pain.  The bones can be bruised by injury.  The muscles or cartilage can be strained by coughing or overwork.  The cartilage can be affected by inflammation and become sore (costochondritis). DIAGNOSIS  Lab tests or other studies may be needed to find the cause of your pain. Your health care provider may have you take a test called an ambulatory electrocardiogram (ECG). An ECG records your heartbeat patterns over a 24-hour period. You may also have other tests, such as:  Transthoracic echocardiogram (TTE). During echocardiography, sound waves are used to evaluate how blood flows through your heart.  Transesophageal  echocardiogram (TEE).  Cardiac monitoring. This allows your health care provider to monitor your heart rate and rhythm in real time.  Holter monitor. This is a portable device that records your heartbeat and can help diagnose heart arrhythmias. It allows your health care provider to track your heart activity for several days, if needed.  Stress tests by exercise or by giving medicine that makes the heart beat faster. TREATMENT   Treatment depends on what may be causing your chest pain. Treatment may include:  Acid blockers for heartburn.  Anti-inflammatory medicine.  Pain medicine for inflammatory conditions.  Antibiotics if an infection is present.  You may be advised to change lifestyle habits. This includes stopping smoking and avoiding alcohol, caffeine, and chocolate.  You may be advised to keep your head raised (elevated) when sleeping. This reduces the chance of acid going backward from your stomach into your esophagus. Most of the time, nonspecific chest pain will improve within 2-3 days with rest and mild pain medicine.  HOME CARE INSTRUCTIONS   If antibiotics were prescribed, take them as directed. Finish them even if you start to feel better.  For the next few days, avoid physical activities that bring on chest pain. Continue physical activities as directed.  Do not use any tobacco products, including cigarettes, chewing tobacco, or electronic cigarettes.  Avoid drinking alcohol.  Only take medicine as directed by your health care provider.  Follow your health care provider's suggestions for further testing if your chest pain does not go away.  Keep any follow-up  appointments you made. If you do not go to an appointment, you could develop lasting (chronic) problems with pain. If there is any problem keeping an appointment, call to reschedule. SEEK MEDICAL CARE IF:   Your chest pain does not go away, even after treatment.  You have a rash with blisters on your  chest.  You have a fever. SEEK IMMEDIATE MEDICAL CARE IF:   You have increased chest pain or pain that spreads to your arm, neck, jaw, back, or abdomen.  You have shortness of breath.  You have an increasing cough, or you cough up blood.  You have severe back or abdominal pain.  You feel nauseous or vomit.  You have severe weakness.  You faint.  You have chills. This is an emergency. Do not wait to see if the pain will go away. Get medical help at once. Call your local emergency services (911 in [place] Do not drive yourself to the hospital. MAKE SURE YOU:   Understand these instructions.  Will watch your condition.  Will get help right away if you are not doing well or get worse. Document Released: 12/13/2004 Document Revised: 03/10/2013 Document Reviewed: 10/09/2007 [facility] Patient Information 2015 [place], [place]. This information is not intended to replace advice given to you by your health care provider. Make sure you discuss any questions you have with your health care provider.   Bursitis Bursitis is a swelling and soreness (inflammation) of a fluid-filled sac (bursa) that overlies and protects a joint. It can be caused by injury, overuse of the joint, arthritis or infection. The joints most likely to be affected are the elbows, shoulders, hips and knees. HOME CARE INSTRUCTIONS   Apply ice to the affected area for 15-20 minutes each hour while awake for 2 days. Put the ice in a plastic bag and place a towel between the bag of ice and your skin.  Rest the injured joint as much as possible, but continue to put the joint through a full range of motion, 4 times per day. (The shoulder joint especially becomes rapidly "frozen" if not used.) When the pain lessens, begin normal slow movements and usual activities.  Only take over-the-counter or prescription medicines for pain, discomfort or fever as directed by your caregiver.  Your caregiver may recommend draining the bursa  and injecting medicine into the bursa. This may help the healing process.  Follow all instructions for follow-up with your caregiver. This includes any orthopedic referrals, physical therapy and rehabilitation. Any delay in obtaining necessary care could result in a delay or failure of the bursitis to heal and chronic pain. SEEK IMMEDIATE MEDICAL CARE IF:   Your pain increases even during treatment.  You develop an oral temperature above 102 F (38.9 C) and have heat and inflammation over the involved bursa. MAKE SURE YOU:   Understand these instructions.  Will watch your condition.  Will get help right away if you are not doing well or get worse. Document Released: 03/02/2000 Document Revised: 05/28/2011 Document Reviewed: 05/25/2013 [facility] Patient Information 2015 [place], [place]. This information is not intended to replace advice given to you by your health care provider. Make sure you discuss any questions you have with your health care provider.  Achilles Tendinitis Achilles tendinitis is inflammation of the tough, cord-like band that attaches the lower muscles of your leg to your heel (Achilles tendon). It is usually caused by overusing the tendon and joint involved.  CAUSES Achilles tendinitis can happen because of:  A sudden increase  in exercise or activity (such as running).  Doing the same exercises or activities (such as jumping) over and over.  Not warming up calf muscles before exercising.  Exercising in shoes that are worn out or not made for exercise.  Having arthritis or a bone growth on the back of the heel bone. This can rub against the tendon and hurt the tendon. SIGNS AND SYMPTOMS The most common symptoms are:  Pain in the back of the leg, just above the heel. The pain usually gets worse with exercise and better with rest.  Stiffness or soreness in the back of the leg, especially in the morning.  Swelling of the skin over the Achilles tendon.  Trouble  standing on tiptoe. Sometimes, an Achilles tendon tears (ruptures). Symptoms of an Achilles tendon rupture can include:  Sudden, severe pain in the back of the leg.  Trouble putting weight on the foot or walking normally. DIAGNOSIS Achilles tendinitis will be diagnosed based on symptoms and a physical examination. An X-ray may be done to check if another condition is causing your symptoms. An MRI may be ordered if your health care provider suspects you may have completely torn your tendon, which is called an Achilles tendon rupture.  TREATMENT  Achilles tendinitis usually gets better over time. It can take weeks to months to heal completely. Treatment focuses on treating the symptoms and helping the injury heal. HOME CARE INSTRUCTIONS   Rest your Achilles tendon and avoid activities that cause pain.  Apply ice to the injured area:  Put ice in a plastic bag.  Place a towel between your skin and the bag.  Leave the ice on for 20 minutes, 2-3 times a day  Try to avoid using the tendon (other than gentle range of motion) while the tendon is painful. Do not resume use until instructed by your health care provider. Then begin use gradually. Do not increase use to the point of pain. If pain does develop, decrease use and continue the above measures. Gradually increase activities that do not cause discomfort until you achieve normal use.  Do exercises to make your calf muscles stronger and more flexible. Your health care provider or physical therapist can recommend exercises for you to do.  Wrap your ankle with an elastic bandage or other wrap. This can help keep your tendon from moving too much. Your health care provider will show you how to wrap your ankle correctly.  Only take over-the-counter or prescription medicines for pain, discomfort, or fever as directed by your health care provider. SEEK MEDICAL CARE IF:   Your pain and swelling increase or pain is uncontrolled with  medicines.  You develop new, unexplained symptoms or your symptoms get worse.  You are unable to move your toes or foot.  You develop warmth and swelling in your foot.  You have an unexplained temperature. MAKE SURE YOU:   Understand these instructions.  Will watch your condition.  Will get help right away if you are not doing well or get worse. Document Released: 12/13/2004 Document Revised: 12/24/2012 Document Reviewed: 10/15/2012 [facility] Patient Information 2015 [place], [place]. This information is not intended to replace advice given to you by your health care provider. Make sure you discuss any questions you have with your health care provider.

## 2014-02-16 NOTE — Progress Notes (Addendum)
Subjective:    Patient ID: Andrew Blair, male    DOB: 03/17/1966, 48 y.o.   MRN: [id number]  This chart was scribed for Merri Ray, MD by Stephania Fragmin, ED Scribe. This patient was seen in room 1 and the patient's care was started at 3:31 PM.   HPI  HPI Comments: Andrew Blair is a 48 y.o. male who presents to [facility] complaining of joint pain and chest pain. Patient was last seen by me in Sept 2014 for a physical. He has a history of hypothyroidism, followed by Dr. Buddy Duty. 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 Also noted to have GERD with intermittent achalasia. Recommend restarted omeprazole and referred to GI at that time.   Patient complains of constant right and left heel pain that began a week or two ago, describing that his heels feel like they have been "whacked with a baseball bat." Patient says that he has not tried any treatments for the heel pain, because he feels that NSAIDs are more effective when used sparingly. Patient has also experienced intermittent joint pain all over his body in the past few months, although it has occurred more in the past week or two. He reports that he wakes up in the morning and take 15 minutes to walk around and loosen the stiffness experienced in all his joints. He notes that he experiences constant pain in his lower back, and an aching pain deep inside his hips. Patient says that is very active, so he has experienced joint pain before. He reports that he been very active this summer, hiking, swimming, and engaging in outdoor activities. He had recently moved to a new two-story house in [place] in May, whereas he had been living in a single level home before. Patient denies N/V, diaphoresis, SOB, calf swelling, calf pain, recent travel, personal or family history of blood clots.   Patient also complains of intermittent chest pain that began  about 5-6 days ago during a visit to his father's house. Patient reports he was sitting and talking when he felt a sudden onset, left chest pain that radiated to his left shoulder and lasted for 5-10 minutes. He began to belch repeatedly, which somewhat relieved his symptoms. To try to relieve his symptoms further, patient reports he stood up, raised his arms, drank some water, and coughed. He reports that the pain he feels is different from the GERD symptoms that he has experienced before - they felt so incredibly sharp that they scared him. He has since started taking a baby aspirin once a day. His chest pains still occur to this day, although they come randomly and are not associated with food or activity. In terms of his GERD, patient reports that he has intermittent trouble swallowing that depends on what he eats. He has not seen an gastroenterologist in the last year. He confesses that he has not taken Omeprazole every day, and only takes them when needed. He says that he has taken Gas-X these last few days.  Patient also adds that his best friend was recently diagnosed with Stage 3 pancreatic cancer and is currently undergoing chemotherapy. Additionally, patient's father, age 110, had experienced SOB weeks prior, has undergone a catheterization this morning, and plans to do emergency quadruple bypass surgery. His father had a stent placed a few years ago. Patient reports his younger brother died from a massive MI  when he was 55. Patient has also found out that he himself will be laid off in February. He reports that he feels like he is dealing with stress well and doesn't feel overwhelmed. He continues to do things for enjoyment and remains active.      There are no active problems to display for this patient.  Past Medical History  Diagnosis Date  . Thyroid disease     hypothyroid  . GERD (gastroesophageal reflux disease)   . Fatigue     discomfort from hemorrhoids leading to lack of sleep and  fatigue   . Allergy   . Anxiety   . Substance abuse    Past Surgical History  Procedure Laterality Date  . Skin surgery  1984    skin graph surgery on left hand   . Hand surgery  1984   No Known Allergies Prior to Admission medications   Medication Sig Start Date End Date Taking? Authorizing Provider  aspirin 81 MG tablet Take 81 mg by mouth daily.   Yes Historical Provider, MD  levothyroxine (SYNTHROID, LEVOTHROID) 100 MCG tablet Take 100 mcg by mouth daily.   Yes Historical Provider, MD  ciprofloxacin (CIPRO) 500 MG tablet Take 1 tablet (500 mg total) by mouth 2 (two) times daily. Patient not taking: Reported on 02/16/2014 07/02/12   Wendie Agreste, MD   History   Social History  . Marital Status: Married    Spouse Name: N/A    Number of Children: N/A  . Years of Education: N/A   Occupational History  . Not on file.   Social History Main Topics  . Smoking status: Current Every Day Smoker -- 0.50 packs/day  . Smokeless tobacco: Not on file  . Alcohol Use: 1.5 oz/week    3 drink(s) per week  . Drug Use: No  . Sexual Activity: Not on file   Other Topics Concern  . Not on file   Social History Narrative   E_Cigarette      Review of Systems  Constitutional: Negative for diaphoresis.  Respiratory: Negative for shortness of breath.   Cardiovascular: Positive for chest pain. Negative for leg swelling.  Gastrointestinal: Negative for nausea and vomiting.  Musculoskeletal: Positive for back pain.       Joint pain.  Psychiatric/Behavioral: The patient is not nervous/anxious.        Objective:   Physical Exam  Constitutional: He is oriented to person, place, and time. He appears well-developed and well-nourished.  HENT:  Head: Normocephalic and atraumatic.  Eyes: EOM are normal. Pupils are equal, round, and reactive to light.  Neck: No JVD present. Carotid bruit is not present.  Cardiovascular: Normal rate, regular rhythm and normal heart sounds.   No murmur  heard. Pulmonary/Chest: Effort normal and breath sounds normal. He has no rales.  Musculoskeletal: He exhibits tenderness. He exhibits no edema.  Right foot: Tenderness at the Achilles insertion and retrocalcaneal bursa.  Negative Homan's and calves are non-tender.   Neurological: He is alert and oriented to person, place, and time.  Skin: Skin is warm and dry.  Psychiatric: He has a normal mood and affect.  Vitals reviewed.   Filed Vitals:   02/16/14 1408  BP: 104/70  Pulse: 82  Temp: 98 F (36.7 C)  TempSrc: Oral  Resp: 16  Height: 6' 2.5" (1.892 m)  Weight: 220 lb 9.6 oz (100.064 kg)  SpO2: 98%    EKG Reading by Dr. Carlota Raspberry Sinus rhythm rate 63. No acute findings.  [facility] reading (PRIMARY) by  Dr. Carlota Raspberry: CXR: no acute findings or discrete infiltrate seen.  Results for orders placed or performed in visit on 02/16/14  POCT CBC  Result Value Ref Range   WBC 5.6 4.6 - 10.2 K/uL   Lymph, poc 1.8 0.6 - 3.4   POC LYMPH PERCENT 32.3 10 - 50 %L   MID (cbc) 0.1 0 - 0.9   POC MID % 2.2 0 - 12 %M   POC Granulocyte 3.7 2 - 6.9   Granulocyte percent 2.2 (A) 37 - 80 %G   RBC 5.01 4.69 - 6.13 M/uL   Hemoglobin 15.2 14.1 - 18.1 g/dL   HCT, POC 46.4 43.5 - 53.7 %   MCV 92.6 80 - 97 fL   MCH, POC 30.4 27 - 31.2 pg   MCHC 32.8 31.8 - 35.4 g/dL   RDW, POC 13.5 %   Platelet Count, POC 212 142 - 424 K/uL   MPV 7.4 0 - 99.8 fL         Assessment & Plan:   Andrew Blair is a 48 y.o. male Chest tightness - Plan: EKG 12-Lead, Chest pain, unspecified chest pain type - Plan: D-dimer, quantitative, Troponin I, POCT CBC, DG Chest 2 View, Gastroesophageal reflux disease, esophagitis presence not specified, Family history of cardiovascular disease - Plan: Troponin I  - doubt cardiac cause as nonexertional and underlying GERD may be contributory.  Also with multiple stressors, so this may also be contributing to his pain. Reassuring CXR, EKG and CBC in office. As sx's present for  multiple days, will check troponin, but discussed this is not completely exclusive of cardiac dz if normal - depending on timing of sx's.  Will also check ESR for pericarditis and DDimer, but less likely PE.  -even if tests normal, discussed cards eval, RTC or ER if sx's persist.   -rtc to discuss stressors further if needed.   Retrocalcaneal bursitis (back of heel), unspecified laterality - sx care with tylenol, handout below. Holding on NSAIDS pending improvement of chest symptoms. rtc for recheck if persists.   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 ESR pending. Can try otc tylenol, and follow up next few weeks to discuss further if needed. Sooner if worse.   Meds ordered this encounter  Medications  . aspirin 81 MG tablet    Sig: Take 81 mg by mouth daily.   Patient Instructions  2 tests will be back later today. It is less likely this pain is heart related as not experiencing this with exertion, but if not improving with restarting omeprazole for heartburn, can refer you to cardiologist to discuss this further. If any worsening - go to emergency room or 911. IF you feel that stress is affecting you more, return to discuss this further.   Tylenol for aches in joints. Other information below on heel pain. This appears to be a type of bursitis at the heel, but can be some inflammation on achilles tendon as well.   Bursitis Bursitis is a swelling and soreness (inflammation) of a fluid-filled sac (bursa) that overlies and protects a joint. It can be caused by injury, overuse of the joint, arthritis or infection. The joints most likely to be affected are the elbows, shoulders, hips and knees. HOME CARE INSTRUCTIONS   Apply ice to the affected area for 15-20 minutes each hour while awake for 2 days. Put the ice in a plastic bag and place  a towel between the bag of ice and your skin.  Rest the injured joint as much as possible, but  continue to put the joint through a full range of motion, 4 times per day. (The shoulder joint especially becomes rapidly "frozen" if not used.) When the pain lessens, begin normal slow movements and usual activities.  Only take over-the-counter or prescription medicines for pain, discomfort or fever as directed by your caregiver.  Your caregiver may recommend draining the bursa and injecting medicine into the bursa. This may help the healing process.  Follow all instructions for follow-up with your caregiver. This includes any orthopedic referrals, physical therapy and rehabilitation. Any delay in obtaining necessary care could result in a delay or failure of the bursitis to heal and chronic pain. SEEK IMMEDIATE MEDICAL CARE IF:   Your pain increases even during treatment.  You develop an oral temperature above 102 F (38.9 C) and have heat and inflammation over the involved bursa. MAKE SURE YOU:   Understand these instructions.  Will watch your condition.  Will get help right away if you are not doing well or get worse. Document Released: 03/02/2000 Document Revised: 05/28/2011 Document Reviewed: 05/25/2013 [facility] Patient Information 2015 [place], [place]. This information is not intended to replace advice given to you by your health care provider. Make sure you discuss any questions you have with your health care provider.  Achilles Tendinitis Achilles tendinitis is inflammation of the tough, cord-like band that attaches the lower muscles of your leg to your heel (Achilles tendon). It is usually caused by overusing the tendon and joint involved.  CAUSES Achilles tendinitis can happen because of:  A sudden increase in exercise or activity (such as running).  Doing the same exercises or activities (such as jumping) over and over.  Not warming up calf muscles before exercising.  Exercising in shoes that are worn out or not made for exercise.  Having arthritis or a bone growth on  the back of the heel bone. This can rub against the tendon and hurt the tendon. SIGNS AND SYMPTOMS The most common symptoms are:  Pain in the back of the leg, just above the heel. The pain usually gets worse with exercise and better with rest.  Stiffness or soreness in the back of the leg, especially in the morning.  Swelling of the skin over the Achilles tendon.  Trouble standing on tiptoe. Sometimes, an Achilles tendon tears (ruptures). Symptoms of an Achilles tendon rupture can include:  Sudden, severe pain in the back of the leg.  Trouble putting weight on the foot or walking normally. DIAGNOSIS Achilles tendinitis will be diagnosed based on symptoms and a physical examination. An X-ray may be done to check if another condition is causing your symptoms. An MRI may be ordered if your health care provider suspects you may have completely torn your tendon, which is called an Achilles tendon rupture.  TREATMENT  Achilles tendinitis usually gets better over time. It can take weeks to months to heal completely. Treatment focuses on treating the symptoms and helping the injury heal. HOME CARE INSTRUCTIONS   Rest your Achilles tendon and avoid activities that cause pain.  Apply ice to the injured area:  Put ice in a plastic bag.  Place a towel between your skin and the bag.  Leave the ice on for 20 minutes, 2-3 times a day  Try to avoid using the tendon (other than gentle range of motion) while the tendon is painful. Do not resume use  until instructed by your health care provider. Then begin use gradually. Do not increase use to the point of pain. If pain does develop, decrease use and continue the above measures. Gradually increase activities that do not cause discomfort until you achieve normal use.  Do exercises to make your calf muscles stronger and more flexible. Your health care provider or physical therapist can recommend exercises for you to do.  Wrap your ankle with an  elastic bandage or other wrap. This can help keep your tendon from moving too much. Your health care provider will show you how to wrap your ankle correctly.  Only take over-the-counter or prescription medicines for pain, discomfort, or fever as directed by your health care provider. SEEK MEDICAL CARE IF:   Your pain and swelling increase or pain is uncontrolled with medicines.  You develop new, unexplained symptoms or your symptoms get worse.  You are unable to move your toes or foot.  You develop warmth and swelling in your foot.  You have an unexplained temperature. MAKE SURE YOU:   Understand these instructions.  Will watch your condition.  Will get help right away if you are not doing well or get worse. Document Released: 12/13/2004 Document Revised: 12/24/2012 Document Reviewed: 10/15/2012 [facility] Patient Information 2015 [place], [place]. This information is not intended to replace advice given to you by your health care provider. Make sure you discuss any questions you have with your health care provider.      I personally performed the services described in this documentation, which was scribed in my presence. The recorded information has been reviewed and considered, and addended by me as needed.

## 2014-04-12 ENCOUNTER — Emergency Department (HOSPITAL_COMMUNITY): Payer: 59

## 2014-04-12 ENCOUNTER — Encounter (HOSPITAL_COMMUNITY): Payer: Self-pay | Admitting: Emergency Medicine

## 2014-04-12 ENCOUNTER — Emergency Department (HOSPITAL_COMMUNITY)
Admission: EM | Admit: 2014-04-12 | Discharge: 2014-04-12 | Disposition: A | Discharge status: Home / Self Care | Payer: 59 | Attending: Emergency Medicine | Admitting: Emergency Medicine

## 2014-04-12 DIAGNOSIS — K5900 Constipation, unspecified: Secondary | ICD-10-CM | POA: Diagnosis not present

## 2014-04-12 DIAGNOSIS — Z7982 Long term (current) use of aspirin: Secondary | ICD-10-CM | POA: Diagnosis not present

## 2014-04-12 DIAGNOSIS — E039 Hypothyroidism, unspecified: Secondary | ICD-10-CM | POA: Diagnosis not present

## 2014-04-12 DIAGNOSIS — Z8659 Personal history of other mental and behavioral disorders: Secondary | ICD-10-CM | POA: Insufficient documentation

## 2014-04-12 DIAGNOSIS — Z720 Tobacco use: Secondary | ICD-10-CM | POA: Insufficient documentation

## 2014-04-12 DIAGNOSIS — R1011 Right upper quadrant pain: Secondary | ICD-10-CM | POA: Diagnosis not present

## 2014-04-12 LAB — CBC WITH DIFFERENTIAL/PLATELET
BASOS PCT: 1 % (ref 0–1)
Basophils Absolute: 0.1 10*3/uL (ref 0.0–0.1)
EOS PCT: 6 % — AB (ref 0–5)
Eosinophils Absolute: 0.3 10*3/uL (ref 0.0–0.7)
HCT: 43.8 % (ref 39.0–52.0)
Hemoglobin: 15.2 g/dL (ref 13.0–17.0)
LYMPHS ABS: 1.3 10*3/uL (ref 0.7–4.0)
Lymphocytes Relative: 23 % (ref 12–46)
MCH: 31.5 pg (ref 26.0–34.0)
MCHC: 34.7 g/dL (ref 30.0–36.0)
MCV: 90.7 fL (ref 78.0–100.0)
MONOS PCT: 5 % (ref 3–12)
Monocytes Absolute: 0.3 10*3/uL (ref 0.1–1.0)
NEUTROS ABS: 3.7 10*3/uL (ref 1.7–7.7)
Neutrophils Relative %: 66 % (ref 43–77)
PLATELETS: 189 10*3/uL (ref 150–400)
RBC: 4.83 MIL/uL (ref 4.22–5.81)
RDW: 13.2 % (ref 11.5–15.5)
WBC: 5.7 10*3/uL (ref 4.0–10.5)

## 2014-04-12 LAB — COMPREHENSIVE METABOLIC PANEL
ALBUMIN: 4.2 g/dL (ref 3.5–5.2)
ALK PHOS: 40 U/L (ref 39–117)
ALT: 21 U/L (ref 0–53)
ANION GAP: 7 (ref 5–15)
AST: 23 U/L (ref 0–37)
BUN: 16 mg/dL (ref 6–23)
CALCIUM: 9.7 mg/dL (ref 8.4–10.5)
CO2: 28 mmol/L (ref 19–32)
CREATININE: 1.14 mg/dL (ref 0.50–1.35)
Chloride: 107 mmol/L (ref 96–112)
GFR calc Af Amer: 87 mL/min — ABNORMAL LOW (ref >90)
GFR, EST NON AFRICAN AMERICAN: 75 mL/min — AB (ref >90)
Glucose, Bld: 108 mg/dL — ABNORMAL HIGH (ref 70–99)
POTASSIUM: 4.3 mmol/L (ref 3.5–5.1)
SODIUM: 142 mmol/L (ref 135–145)
TOTAL PROTEIN: 7 g/dL (ref 6.0–8.3)
Total Bilirubin: 0.8 mg/dL (ref 0.3–1.2)

## 2014-04-12 LAB — LIPASE, BLOOD: Lipase: 28 U/L (ref 11–59)

## 2014-04-12 MED ORDER — MORPHINE SULFATE 4 MG/ML IJ SOLN
4.0000 mg | Freq: Once | INTRAMUSCULAR | Status: AC
  Administered 2014-04-12: 4 mg via INTRAVENOUS
  Filled 2014-04-12: qty 1

## 2014-04-12 MED ORDER — NAPROXEN 500 MG PO TABS: 500.0000 mg | ORAL_TABLET | Freq: Two times a day (BID) | ORAL | Status: DC

## 2014-04-12 MED ORDER — TRAMADOL HCL 50 MG PO TABS: 50.0000 mg | ORAL_TABLET | Freq: Four times a day (QID) | ORAL | Status: DC | PRN

## 2014-04-12 MED ORDER — ONDANSETRON HCL 4 MG/2ML IJ SOLN
4.0000 mg | Freq: Once | INTRAMUSCULAR | Status: AC
  Administered 2014-04-12: 4 mg via INTRAVENOUS
  Filled 2014-04-12: qty 2

## 2014-04-12 NOTE — ED Provider Notes (Signed)
CSN: [id number]     Arrival date & time 04/12/14  1234 History   First MD Initiated Contact with Patient 04/12/14 1539     Chief Complaint  Patient presents with  . Abdominal Pain    (Consider location/radiation/quality/duration/timing/severity/associated sxs/prior Treatment) HPI   Andrew Blair is a 49 year old male presenting with abdominal pain 1 week. He's had this nagging pain for several months however in the last week has become more frequent and more bothersome. He notes the pain is constant dull ache over his his abdomen worse in the right upper quadrant. He states the pain is worsened after eating. He is also had some abdominal bloating and distention, worse after eating. He has had some smaller less frequent bowel movements but he is passing gas. He reports drinking alcohol approximately 6 drinks over the weekends.  He denies any fevers, chills, nausea, vomiting.  Past Medical History  Diagnosis Date  . Thyroid disease     hypothyroid  . GERD (gastroesophageal reflux disease)   . Fatigue     discomfort from hemorrhoids leading to lack of sleep and fatigue   . Allergy   . Anxiety   . Substance abuse    Past Surgical History  Procedure Laterality Date  . Skin surgery  1984    skin graph surgery on left hand   . Hand surgery  1984   Family History  Problem Relation Age of Onset  . Hypertension Mother   . Heart murmur Mother   . Kidney disease Father     stones  . Heart disease Father   . Cancer Maternal Grandmother   . Heart disease Maternal Grandfather   . Heart disease Paternal Grandmother   . Heart disease Paternal Grandfather    History  Substance Use Topics  . Smoking status: Current Every Day Smoker -- 0.50 packs/day  . Smokeless tobacco: Not on file  . Alcohol Use: 1.5 oz/week    3 drink(s) per week    Review of Systems  Constitutional: Negative for fever and chills.  HENT: Negative for sore throat.   Eyes: Negative for visual disturbance.    Respiratory: Negative for cough and shortness of breath.   Cardiovascular: Negative for chest pain and leg swelling.  Gastrointestinal: Positive for abdominal pain and constipation. Negative for nausea, vomiting and diarrhea.  Genitourinary: Negative for dysuria.  Musculoskeletal: Negative for myalgias.  Skin: Negative for rash.  Neurological: Negative for weakness, numbness and headaches.    Allergies  Review of patient's allergies indicates no known allergies.  Home Medications   Prior to Admission medications   Medication Sig Start Date End Date Taking? Authorizing Provider  aspirin 81 MG tablet Take 81 mg by mouth daily.   Yes Historical Provider, MD  docusate sodium (ENEMEEZ) 283 MG enema Place 1 enema rectally daily as needed for severe constipation.   Yes Historical Provider, MD  ibuprofen (ADVIL,MOTRIN) 600 MG tablet Take 600 mg by mouth every 6 (six) hours as needed for moderate pain.   Yes Historical Provider, MD  levothyroxine (SYNTHROID, LEVOTHROID) 150 MCG tablet Take 150 mcg by mouth daily before breakfast.   Yes Historical Provider, MD  polyethylene glycol (MIRALAX / GLYCOLAX) packet Take 17 g by mouth daily as needed for mild constipation.   Yes Historical Provider, MD  ciprofloxacin (CIPRO) 500 MG tablet Take 1 tablet (500 mg total) by mouth 2 (two) times daily. Patient not taking: Reported on 02/16/2014 07/02/12   Wendie Agreste, MD   BP  110/70 mmHg  Pulse 57  Temp(Src) 98 F (36.7 C) (Oral)  Resp 13  SpO2 96% Physical Exam  Constitutional: He appears well-developed and well-nourished. No distress.  HENT:  Head: Normocephalic and atraumatic.  Eyes: Conjunctivae are normal.  Neck: Neck supple. No thyromegaly present.  Cardiovascular: Normal rate, regular rhythm and intact distal pulses.   Pulmonary/Chest: Effort normal and breath sounds normal. No respiratory distress. He has no wheezes. He has no rales. He exhibits no tenderness.  Abdominal: Soft. Normal  appearance and bowel sounds are normal. There is tenderness in the right upper quadrant. There is no rigidity, no rebound, no guarding, no CVA tenderness, no tenderness at McBurney's point and negative Murphy's sign.    Musculoskeletal: He exhibits no tenderness.  Lymphadenopathy:    He has no cervical adenopathy.  Neurological: He is alert.  Skin: Skin is warm and dry. No rash noted. He is not diaphoretic.  Psychiatric: He has a normal mood and affect.  Nursing note and vitals reviewed.   ED Course  Procedures (including critical care time) Labs Review Labs Reviewed  CBC WITH DIFFERENTIAL/PLATELET - Abnormal; Notable for the following:    Eosinophils Relative 6 (*)    All other components within normal limits  COMPREHENSIVE METABOLIC PANEL - Abnormal; Notable for the following:    Glucose, Bld 108 (*)    GFR calc non Af Amer 75 (*)    GFR calc Af Amer 87 (*)    All other components within normal limits  LIPASE, BLOOD    Imaging Review Dg Abd Acute W/chest  04/12/2014   CLINICAL DATA:  Right lower quadrant abdominal discomfort for 1 week; some difficulty moving as bowel; abdominal pain worse postprandially ; shortness of breath and burping after meals ; history of tobacco use  EXAM: ACUTE ABDOMEN SERIES (ABDOMEN 2 VIEW & CHEST 1 VIEW)  COMPARISON:  PA and lateral chest x-ray of February 16, 2014  FINDINGS: The chest film reveals the lungs to be adequately inflated. The interstitial markings are coarse likely reflecting the patient's smoking history. There is no alveolar pneumonia. The heart and pulmonary vascularity are normal.  Within the abdomen the bowel gas pattern is within the limits of normal. The stool burden does not appear abnormally increased. There is no large or small bowel obstructive pattern. No appendicolith is demonstrated. There is an approximately 5 mm diameter calcification which projects in the right upper quadrant of the abdomen. This is a nonspecific finding. The  bony structures are unremarkable.  IMPRESSION: 1. There is no active cardiopulmonary disease. There is evidence of chronic tobacco use. 2. No acute intra-abdominal abnormality is demonstrated. Gallstones may be present. Given the patient's symptoms, right upper quadrant ultrasound may be useful.   Electronically Signed   By: David  Martinique   On: 04/12/2014 13:51     EKG Interpretation None      MDM   Final diagnoses:  RUQ pain   49 yo with generalized abd pain worse in RUQ. CBC, CMP, Lipase and Acute abdomen done in triage. Labs reviewed: No significant abnormalities noted. Discussed case with Dr. Jeanell Sparrow. Xray suggests gallstones but does not show bowel obstruction or significantly large stool burden.  RUQ [place] ordered, however no gallstones or findings to suggest cholecystitis. Discussed findings with pt. Pain was managed in the ED. Discussed continuing to use miralax daily to create complete bowel emptying. Pt is well-appearing, in no acute distress and vital signs reviewed and non concerning. He appears safe to be  discharged.  Discharge include follow-up with GI.  Return precautions provided. Pt aware of plan and in agreement.     Filed Vitals:   04/12/14 1630 04/12/14 1700 04/12/14 1730 04/12/14 1856  BP: 110/70 114/79 118/76 119/78  Pulse: 57 60 69 69  Temp:    98 F (36.7 C)  TempSrc:    Oral  Resp: 13 13 11 12   SpO2: 96% 99% 98% 98%   Meds given in ED:  Medications  morphine 4 MG/ML injection 4 mg (4 mg Intravenous Given 04/12/14 1726)  ondansetron (ZOFRAN) injection 4 mg (4 mg Intravenous Given 04/12/14 1726)    Discharge Medication List as of 04/12/2014  6:54 PM      Ordered 04/12/14 0000  naproxen (NAPROSYN) 500 MG tablet 2 times daily Discontinue Reprint  04/12/14 1904 04/12/14 0000  traMADol (ULTRAM) 50 MG tablet Every 6 hours PRN Discontinue Reprint  01/25     Britt Bottom, NP 04/14/14 4917  Shaune Pollack, MD 04/15/14 1650

## 2014-04-12 NOTE — Discharge Instructions (Signed)
Please follow the directions provided. Follow-up with your primary care doctor and the GI doctor for further evaluation and management of your symptoms. He may take the mural ask 1 capful daily to help reduce soft complete bowel movements. Don't hesitate to return for any new, worsening, or concerning symptoms.   SEEK IMMEDIATE MEDICAL CARE IF:  Your pain does not go away within 2 hours.  You keep throwing up (vomiting).  Your pain is felt only in portions of the abdomen, such as the right side or the left lower portion of the abdomen.  You pass bloody or black tarry stools.

## 2014-04-12 NOTE — ED Notes (Signed)
Pt c/o right sided abd pain x 1 week; pt sts some difficulty going to bathroom; pt sts pain worse with eating

## 2014-04-15 ENCOUNTER — Telehealth: Payer: Self-pay

## 2014-04-15 NOTE — Telephone Encounter (Signed)
Established pt with Dr. Collene Mares

## 2014-04-15 NOTE — Telephone Encounter (Signed)
Patient was seen in the ER 04/12/14 and needs a referral to [facility]

## 2014-04-15 NOTE — Telephone Encounter (Signed)
Spoke with pt, he called [facility] and requested an appt. They are going to get his records from the hospital and see if they can schedule an appt without a referral. I suggested that he follow up with Dr. Carlota Raspberry also from hospital admission. Pt understood.

## 2014-04-22 ENCOUNTER — Other Ambulatory Visit: Payer: Self-pay | Admitting: Gastroenterology

## 2014-04-22 DIAGNOSIS — R1084 Generalized abdominal pain: Secondary | ICD-10-CM

## 2014-04-27 ENCOUNTER — Ambulatory Visit
Admission: RE | Admit: 2014-04-27 | Discharge: 2014-04-27 | Disposition: A | Discharge status: Home / Self Care | Payer: 59 | Source: Ambulatory Visit | Attending: Gastroenterology | Admitting: Gastroenterology

## 2014-04-27 DIAGNOSIS — R1084 Generalized abdominal pain: Secondary | ICD-10-CM

## 2014-04-27 MED ORDER — IOHEXOL 300 MG/ML  SOLN
125.0000 mL | Freq: Once | INTRAMUSCULAR | Status: AC | PRN
  Administered 2014-04-27: 125 mL via INTRAVENOUS

## 2015-08-09 ENCOUNTER — Ambulatory Visit (INDEPENDENT_AMBULATORY_CARE_PROVIDER_SITE_OTHER): Payer: 59 | Admitting: Family Medicine

## 2015-08-09 ENCOUNTER — Encounter: Payer: Self-pay | Admitting: Family Medicine

## 2015-08-09 VITALS — BP 112/74 | HR 76 | Ht 74.5 in | Wt 215.0 lb

## 2015-08-09 DIAGNOSIS — R0602 Shortness of breath: Secondary | ICD-10-CM

## 2015-08-09 DIAGNOSIS — J019 Acute sinusitis, unspecified: Secondary | ICD-10-CM

## 2015-08-09 DIAGNOSIS — J209 Acute bronchitis, unspecified: Secondary | ICD-10-CM | POA: Diagnosis not present

## 2015-08-09 DIAGNOSIS — R05 Cough: Secondary | ICD-10-CM | POA: Diagnosis not present

## 2015-08-09 DIAGNOSIS — R9389 Abnormal findings on diagnostic imaging of other specified body structures: Secondary | ICD-10-CM | POA: Insufficient documentation

## 2015-08-09 DIAGNOSIS — H6980 Other specified disorders of Eustachian tube, unspecified ear: Secondary | ICD-10-CM | POA: Insufficient documentation

## 2015-08-09 DIAGNOSIS — H6983 Other specified disorders of Eustachian tube, bilateral: Secondary | ICD-10-CM

## 2015-08-09 DIAGNOSIS — R059 Cough, unspecified: Secondary | ICD-10-CM

## 2015-08-09 MED ORDER — METHYLPREDNISOLONE SODIUM SUCC 125 MG IJ SOLR
125.0000 mg | Freq: Once | INTRAMUSCULAR | Status: AC
  Administered 2015-08-09: 125 mg via INTRAMUSCULAR

## 2015-08-09 MED ORDER — PREDNISONE 50 MG PO TABS: 50.0000 mg | ORAL_TABLET | Freq: Every day | ORAL | Status: DC

## 2015-08-09 MED ORDER — ALBUTEROL SULFATE HFA 108 (90 BASE) MCG/ACT IN AERS: 2.0000 | INHALATION_SPRAY | Freq: Four times a day (QID) | RESPIRATORY_TRACT | Status: DC | PRN

## 2015-08-09 MED ORDER — AMOXICILLIN-POT CLAVULANATE 875-125 MG PO TABS
1.0000 | ORAL_TABLET | Freq: Two times a day (BID) | ORAL | Status: DC
Start: 2015-08-09 — End: 2015-08-26

## 2015-08-09 NOTE — Patient Instructions (Signed)
Take all antibiotics Use prednisone 5 days Albuterol inhaler only as needed SOB/Wheeze If NI/W next couple days---> call clinic  Acute Bronchitis Bronchitis is inflammation of the airways that extend from the windpipe into the lungs (bronchi). The inflammation often causes mucus to develop. This leads to a cough, which is the most common symptom of bronchitis.  In acute bronchitis, the condition usually develops suddenly and goes away over time, usually in a couple weeks. Smoking, allergies, and asthma can make bronchitis worse. Repeated episodes of bronchitis may cause further lung problems.  CAUSES Acute bronchitis is most often caused by the same virus that causes a cold. The virus can spread from person to person (contagious) through coughing, sneezing, and touching contaminated objects. SIGNS AND SYMPTOMS   Cough.   Fever.   Coughing up mucus.   Body aches.   Chest congestion.   Chills.   Shortness of breath.   Sore throat.  DIAGNOSIS  Acute bronchitis is usually diagnosed through a physical exam. Your health care provider will also ask you questions about your medical history. Tests, such as chest X-rays, are sometimes done to rule out other conditions.  TREATMENT  Acute bronchitis usually goes away in a couple weeks. Oftentimes, no medical treatment is necessary. Medicines are sometimes given for relief of fever or cough. Antibiotic medicines are usually not needed but may be prescribed in certain situations. In some cases, an inhaler may be recommended to help reduce shortness of breath and control the cough. A cool mist vaporizer may also be used to help thin bronchial secretions and make it easier to clear the chest.  HOME CARE INSTRUCTIONS  Get plenty of rest.   Drink enough fluids to keep your urine clear or pale yellow (unless you have a medical condition that requires fluid restriction). Increasing fluids may help thin your respiratory secretions (sputum) and  reduce chest congestion, and it will prevent dehydration.   Take medicines only as directed by your health care provider.  If you were prescribed an antibiotic medicine, finish it all even if you start to feel better.  Avoid smoking and secondhand smoke. Exposure to cigarette smoke or irritating chemicals will make bronchitis worse. If you are a smoker, consider using nicotine gum or skin patches to help control withdrawal symptoms. Quitting smoking will help your lungs heal faster.   Reduce the chances of another bout of acute bronchitis by washing your hands frequently, avoiding people with cold symptoms, and trying not to touch your hands to your mouth, nose, or eyes.   Keep all follow-up visits as directed by your health care provider.  SEEK MEDICAL CARE IF: Your symptoms do not improve after 1 week of treatment.  SEEK IMMEDIATE MEDICAL CARE IF:  You develop an increased fever or chills.   You have chest pain.   You have severe shortness of breath.  You have bloody sputum.   You develop dehydration.  You faint or repeatedly feel like you are going to pass out.  You develop repeated vomiting.  You develop a severe headache. MAKE SURE YOU:   Understand these instructions.  Will watch your condition.  Will get help right away if you are not doing well or get worse.   This information is not intended to replace advice given to you by your health care provider. Make sure you discuss any questions you have with your health care provider.   Document Released: 04/12/2004 Document Revised: 03/26/2014 Document Reviewed: 08/26/2012 Elsevier Interactive Patient Education 2016  [employer].

## 2015-08-09 NOTE — Progress Notes (Signed)
    Subjective:    new to my practice  CC: cough/cold sx  HPI:  Pt presents with URI sx for 6 days now getting worse.  Complains of having upper respiratory symptoms of runny nose, sore throat, bad sinus congestion for approximately 4 days and then 2 days ago it went into his chest.   Now complains of productive cough- thick yellowish green, which occasionally hurts if he coughs real deep, headache secondary to coughing, bad head congestion/ feeling "fuzzy headed",  Denies F/C currently but possibly did have a couple days ago, No face pain or ear pain, No N/V/D, No DIB, but does have wheezing especially at night and feels more short of breath than usual walking up stairs, No Rash.  Has tried an over-the-counter cough medicine, and chest decongestant.   Overall getting worse.      Past medical history, Surgical history, Family history, Social history, Allergies, and Medications have been entered into the medical record, reviewed and changed as needed. Please see those sections for details  Review of Systems:  Pertinent positives noted in HPI above, otherwise 14 point ROS was negative  Current medications:  Levothyroxine 150 g tablet, one by mouth daily  Objective:   BP 112/74 mmHg  Pulse 76  Ht 6' 2.5" (1.892 m)  Wt 215 lb (97.523 kg)  BMI 27.24 kg/m2  SpO2 100%   General: Well Developed, well nourished, appropriate for stated age.  Neuro: Alert and oriented x3, no acute distress  HEENT: Normocephalic, atraumatic, pupils equal round reactive to light, neck supple, no masses, no painful lymphadenopathy, TM's intact B/L with bilaterally blunting of light reflex and slight bulge. Nares- patent, clear d/c, OP- clear, mild erythema, otherwise normal Skin: Warm and dry, no gross rash. Cardiac: RRR, S1 S2,  no murmurs rubs or gallops.  Respiratory: ECTA B/L and A/P, a couple of expiratory wheezes on forced exhalation only- diffuse not localized. Not using accessory muscles, speaking in full  sentences- unlabored. Vascular:  No gross lower ext edema, cap RF less 2 sec. Psych: No SI/HI, Insight and judgement good   Impression and Recommendations:    1)  Acute bronchitis;  sob 2) Productive cough 3) Rhinosinusitis with eustachian tube dysfunction component and possible early signs of benign positional vertigo associated with middle ear effusion and pathology  Take all antibiotics as prescribed. Risks /benefits of all meds given discussed in full with patient. All questions answered. Site Medrol IM injection today and patient knows to start prednisone * 5 days tomorrow Albuterol inhaler only as needed SOB/Wheeze If NI/W next couple days---> call clinic   - Viral vs Allergic vs Bacterial causes for pt's symptoms reveiwed.  Since 6 days of symptoms and yesterday was his worst day yet, I believe we need to cover for bacterial infections and treat with antibiotics and more aggressive therapies at this time. - Supportive care and various OTC medications risks and benefits discussed in addition to any prescribed. All questions about medications answered.    - Call or RTC if new symptoms, or if no improvement or worse over next couple days.  Will consider ABX at that time if sx continue past 5-7 days and worsening.

## 2015-08-12 ENCOUNTER — Ambulatory Visit (INDEPENDENT_AMBULATORY_CARE_PROVIDER_SITE_OTHER): Payer: 59 | Admitting: Family Medicine

## 2015-08-12 ENCOUNTER — Encounter: Payer: Self-pay | Admitting: Family Medicine

## 2015-08-12 VITALS — BP 116/68 | HR 60 | Temp 98.1°F | Ht 74.5 in | Wt 216.0 lb

## 2015-08-12 DIAGNOSIS — J209 Acute bronchitis, unspecified: Secondary | ICD-10-CM | POA: Diagnosis not present

## 2015-08-12 DIAGNOSIS — K581 Irritable bowel syndrome with constipation: Secondary | ICD-10-CM | POA: Diagnosis not present

## 2015-08-12 DIAGNOSIS — F430 Acute stress reaction: Secondary | ICD-10-CM

## 2015-08-12 NOTE — Progress Notes (Signed)
Subjective:    CC: Follow-up  HPI: Pleasant 50 year old male who comes in today as a follow-up from his appointment 3 days ago in which we treated him for acute bronchitis and gave him Augmentin, a steroid injection in the office and followed up with prednisone 50 mg a day for 5 days and albuterol inhaler when necessary. He states he's feeling much better, over 50% improvement. Still with occasional cough especially at night but the albuterol seems to be helping with that some. He denies any shortness of breath or wheeze. He denies any fever or chills.  Today he presents to discuss a different issue.   -Patient describes a fullness sensation/ bloating sensation of his right lower anterior ribcage region and right upper quadrant of his abdomen for 18-20 months now. - He comes in mentioning it today because with the antibiotics and steroids that he has had over the past couple days, he tells me that the sensation has completely gone away and he wonders why it did so with these medicines.   -He denies pain and cannot tell me what makes it worse or better.  -Sometimes the fullness sensation travels around his abdomen to different parts and sometimes he just feels in his right anterior lower rib cage.   -He does not feel it all the time and states he usually notices it more if he is having a stressful day.  -He denies any nausea, vomiting or diarrhea, but does have constipation from time to time and "small stools." .  Does not seem to be related to certain foods but he knows that when he is stressed out, it tends to be worse.   -He tells me has seen a couple doctors for this in the past when it first started and had extensive lab work as well as an abdominal ultrasound. The workup was negative at that time.  - This sensation started during a time that two of his very good friends passed away,  1 from pancreatic cancer. Patient admits this made him very worried and thought something was going on with  himself.    Admits to feeling a lot of different physical symptoms when he is under stress. Admits to having poor coping skills at times and can have a very short fuse with his wife and children in certain stressful situations. Can feel depressed at times put bounces back quickly.  He has never been on anything for this in the past.  Past medical history, Surgical history, Family history not pertinant except as noted below, Social history, Allergies, and medications have been entered into the medical record, reviewed and changed as needed.   Review of Systems: No fever/ chills, night sweats, no unintended weight loss, No chest pain, or increased shortness of breath. Pertinent positives and negatives noted in HPI above   Objective:   BP 116/68 mmHg  Pulse 60  Temp(Src) 98.1 F (36.7 C) (Oral)  Ht 6' 2.5" (1.892 m)  Wt 216 lb (97.977 kg)  BMI 27.37 kg/m2  SpO2 98%  General: Well Developed, well nourished, appropriate for stated age.  Neuro: Alert and oriented x3, extra-ocular muscles intact, sensation grossly intact.  HEENT: Normocephalic, atraumatic, pupils equal round reactive to light, neck supple, no masses, no lymphadenopathy.  Skin: Warm and dry, no gross rash. Cardiac: RRR, S1 S2,  no murmurs rubs or gallops.  Respiratory: ECTA B/L and anteriorly and posteriorly., Not using accessory muscles, speaking in full sentences-unlabored. Thoracic:  R anterior lower costo-vertebral angle  appears slightly protruded/larger in size versus the left.  No tenderness to palpation. No crepitus. No rashes. Abd: Positive bowel sounds 4, no guarding rigidity or rebound, no distention, no organomegaly Vascular:  No gross lower ext edema, cap RF less 2 sec. Psych: No SI/HI, Insight and judgement good   Impression and Recommendations:    Acute bronchitis Resolving, finish all meds as prescribed. If doesn't cont to improve- f/up or call to discuss  Irritable bowel syndrome with  constipation Explained to pt it is unclear what is causing these sx.  Advised to keep food diary and sx diary and see if related to certain foods vs emotional component etc.   We will obtain fasting labs to include routine and GI specific ones.  Pt wishes to forego imaging of any sort at this time... Understands it may be warranted in the future.  Acute gross stress reaction Did discuss with pt healthy habits of exercise, prudent diet etc and stress mgt/ coping mechanisms. Will consider FOD-MAP elimination diet in future.  Extensive routine counseling discussed with patient today.  Will order fasting Bld Work for near future- health maintenance panel and some GI specific ( CBC, CMP, A1c, TSH, PSA, Vit D, FLP, Tissue transglutaminase antibodies for celiac disease (tTG-IgA)) Pt to make appt for this in next couple days.  - pt will keep food/ sx diary - extensive dietary counseling done - Pt was seen in the office today for over 45+ minutes, with over 50% time spent in face the face counseling and coordination of care

## 2015-08-12 NOTE — Assessment & Plan Note (Signed)
Resolving, finish all meds as prescribed. If doesn't cont to improve- f/up or call to discuss

## 2015-08-12 NOTE — Assessment & Plan Note (Addendum)
Explained to pt it is unclear what is causing these sx.  Advised to keep food diary and sx diary and see if related to certain foods vs emotional component etc.   We will obtain fasting labs to include routine and GI specific ones.  Pt wishes to forego imaging of any sort at this time... Understands it may be warranted in the future.

## 2015-08-16 NOTE — Assessment & Plan Note (Addendum)
Did discuss with pt healthy habits of exercise, prudent diet etc and stress mgt/ coping mechanisms. Will consider FOD-MAP elimination diet in future.  Extensive routine counseling discussed with patient today.

## 2015-08-19 ENCOUNTER — Other Ambulatory Visit (INDEPENDENT_AMBULATORY_CARE_PROVIDER_SITE_OTHER): Payer: 59

## 2015-08-19 DIAGNOSIS — K581 Irritable bowel syndrome with constipation: Secondary | ICD-10-CM

## 2015-08-19 DIAGNOSIS — Z0000 Encounter for general adult medical examination without abnormal findings: Secondary | ICD-10-CM

## 2015-08-19 DIAGNOSIS — Z125 Encounter for screening for malignant neoplasm of prostate: Secondary | ICD-10-CM

## 2015-08-19 DIAGNOSIS — Z1321 Encounter for screening for nutritional disorder: Secondary | ICD-10-CM

## 2015-08-19 LAB — CBC WITH DIFFERENTIAL/PLATELET
BASOS ABS: 48 {cells}/uL (ref 0–200)
Basophils Relative: 1 %
EOS PCT: 4 %
Eosinophils Absolute: 192 cells/uL (ref 15–500)
HCT: 44.5 % (ref 38.5–50.0)
Hemoglobin: 14.7 g/dL (ref 13.2–17.1)
Lymphocytes Relative: 30 %
Lymphs Abs: 1440 cells/uL (ref 850–3900)
MCH: 29.9 pg (ref 27.0–33.0)
MCHC: 33 g/dL (ref 32.0–36.0)
MCV: 90.6 fL (ref 80.0–100.0)
MPV: 10.2 fL (ref 7.5–12.5)
Monocytes Absolute: 384 cells/uL (ref 200–950)
Monocytes Relative: 8 %
NEUTROS PCT: 57 %
Neutro Abs: 2736 cells/uL (ref 1500–7800)
Platelets: 238 10*3/uL (ref 140–400)
RBC: 4.91 MIL/uL (ref 4.20–5.80)
RDW: 14.8 % (ref 11.0–15.0)
WBC: 4.8 10*3/uL (ref 3.8–10.8)

## 2015-08-20 LAB — COMPREHENSIVE METABOLIC PANEL
ALT: 40 U/L (ref 9–46)
AST: 32 U/L (ref 10–40)
Albumin: 4.4 g/dL (ref 3.6–5.1)
Alkaline Phosphatase: 45 U/L (ref 40–115)
BILIRUBIN TOTAL: 0.5 mg/dL (ref 0.2–1.2)
BUN: 19 mg/dL (ref 7–25)
CALCIUM: 9.5 mg/dL (ref 8.6–10.3)
CHLORIDE: 104 mmol/L (ref 98–110)
CO2: 26 mmol/L (ref 20–31)
Creat: 0.97 mg/dL (ref 0.60–1.35)
GLUCOSE: 96 mg/dL (ref 65–99)
Potassium: 4.3 mmol/L (ref 3.5–5.3)
SODIUM: 140 mmol/L (ref 135–146)
Total Protein: 6.8 g/dL (ref 6.1–8.1)

## 2015-08-20 LAB — VITAMIN D 25 HYDROXY (VIT D DEFICIENCY, FRACTURES): Vit D, 25-Hydroxy: 24 ng/mL — ABNORMAL LOW (ref 30–100)

## 2015-08-20 LAB — LIPID PANEL
CHOL/HDL RATIO: 4.9 ratio (ref <=5.0)
Cholesterol: 287 mg/dL — ABNORMAL HIGH (ref 125–200)
HDL: 58 mg/dL (ref >=40)
LDL Cholesterol: 174 mg/dL — ABNORMAL HIGH (ref <130)
TRIGLYCERIDES: 274 mg/dL — AB (ref <150)
VLDL: 55 mg/dL — AB (ref <30)

## 2015-08-20 LAB — HEMOGLOBIN A1C
Hgb A1c MFr Bld: 5.8 % — ABNORMAL HIGH (ref <5.7)
Mean Plasma Glucose: 120 mg/dL

## 2015-08-20 LAB — TSH: TSH: 7.92 m[IU]/L — AB (ref 0.40–4.50)

## 2015-08-20 LAB — PSA: PSA: 0.88 ng/mL (ref <=4.00)

## 2015-08-26 ENCOUNTER — Ambulatory Visit (INDEPENDENT_AMBULATORY_CARE_PROVIDER_SITE_OTHER): Payer: 59 | Admitting: Family Medicine

## 2015-08-26 ENCOUNTER — Encounter: Payer: Self-pay | Admitting: Family Medicine

## 2015-08-26 VITALS — BP 118/66 | HR 71 | Ht 74.5 in | Wt 214.9 lb

## 2015-08-26 DIAGNOSIS — F430 Acute stress reaction: Secondary | ICD-10-CM

## 2015-08-26 DIAGNOSIS — K219 Gastro-esophageal reflux disease without esophagitis: Secondary | ICD-10-CM

## 2015-08-26 DIAGNOSIS — E079 Disorder of thyroid, unspecified: Secondary | ICD-10-CM | POA: Diagnosis not present

## 2015-08-26 DIAGNOSIS — E559 Vitamin D deficiency, unspecified: Secondary | ICD-10-CM | POA: Diagnosis not present

## 2015-08-26 DIAGNOSIS — E785 Hyperlipidemia, unspecified: Secondary | ICD-10-CM

## 2015-08-26 DIAGNOSIS — R7303 Prediabetes: Secondary | ICD-10-CM | POA: Diagnosis not present

## 2015-08-26 MED ORDER — ATORVASTATIN CALCIUM 20 MG PO TABS: 20.0000 mg | ORAL_TABLET | Freq: Every day | ORAL | Status: DC

## 2015-08-26 NOTE — Progress Notes (Signed)
     Subjective:    HPI: Andrew Blair is a 50 y.o. male who presents to [facility] today for blood work.  No acute complaints.  Doing well.   Past Medical History  Diagnosis Date  . Thyroid disease     hypothyroid  . GERD (gastroesophageal reflux disease)   . Fatigue     discomfort from hemorrhoids leading to lack of sleep and fatigue   . Allergy   . Anxiety   . Substance abuse     Past Surgical History  Procedure Laterality Date  . Skin surgery  1984    skin graph surgery on left hand   . Hand surgery  1984    @HXFAM @  History  Drug Use No  ,  History  Alcohol Use  . 1.5 oz/week  . 3 Standard drinks or equivalent per week  ,  History  Smoking status  . Former Smoker -- 0.50 packs/day for 30 years  Smokeless tobacco  . Current User  ,  History  Sexual Activity  . Sexual Activity: Not on file    Patient's Medications  New Prescriptions   No medications on file  Previous Medications   ALBUTEROL (PROVENTIL HFA;VENTOLIN HFA) 108 (90 BASE) MCG/ACT INHALER    Inhale 2 puffs into the lungs every 6 (six) hours as needed for wheezing.   IBUPROFEN (ADVIL,MOTRIN) 600 MG TABLET    Take 600 mg by mouth every 6 (six) hours as needed for moderate pain.   LEVOTHYROXINE (SYNTHROID, LEVOTHROID) 150 MCG TABLET    Take 150 mcg by mouth daily before breakfast.   POLYETHYLENE GLYCOL (MIRALAX / GLYCOLAX) PACKET    Take 17 g by mouth daily as needed for mild constipation.  Modified Medications   No medications on file  Discontinued Medications   AMOXICILLIN-CLAVULANATE (AUGMENTIN) 875-125 MG TABLET    Take 1 tablet by mouth 2 (two) times daily.   NAPROXEN (NAPROSYN) 500 MG TABLET    Take 1 tablet (500 mg total) by mouth 2 (two) times daily.   PREDNISONE (DELTASONE) 50 MG TABLET    Take 1 tablet (50 mg total) by mouth daily.    Review of patient's allergies indicates no known allergies.  Scheduled Meds: Continuous Infusions: PRN  Meds:.   Objective:    General: Well Developed, well nourished, and in no acute distress.  HEENT: Normocephalic, atraumatic Skin: Warm and dry, cap RF less 2 sec, good turgor Respiratory: speaking in full sentences. NeuroM-Sk: Ambulates w/o assistance Psych: A and O *3    Impression and Recommendations:    @LABCOMPINFO @  No orders of the defined types were placed in this encounter.    Note: This document was prepared using Dragon voice recognition software and may include unintentional dictation errors.

## 2015-08-26 NOTE — Patient Instructions (Addendum)
- add fish oil supp. - start lipitor low dose b/c 5.3 % 10 yr risk and FAM HX -Pt will let me know about dose of thyroid meds so we can change dose and inc. Need reck TSH 6 wks - purdent diet and exercise d/c pt - 1/2 weight in ounces of water per day - vit D3 5,000 IU per day- every day  Cholesterol Cholesterol is a white, waxy, fat-like substance needed by your body in small amounts. The liver makes all the cholesterol you need. Cholesterol is carried from the liver by the blood through the blood vessels. Deposits of cholesterol (plaque) may build up on blood vessel walls. These make the arteries narrower and stiffer. Cholesterol plaques increase the risk for heart attack and stroke.  You cannot feel your cholesterol level even if it is very high. The only way to know it is high is with a blood test. Once you know your cholesterol levels, you should keep a record of the test results. Work with your health care provider to keep your levels in the desired range.  WHAT DO THE RESULTS MEAN?  Total cholesterol is a rough measure of all the cholesterol in your blood.   LDL is the so-called bad cholesterol. This is the type that deposits cholesterol in the walls of the arteries. You want this level to be low.   HDL is the good cholesterol because it cleans the arteries and carries the LDL away. You want this level to be high.  Triglycerides are fat that the body can either burn for energy or store. High levels are closely linked to heart disease.  WHAT ARE THE DESIRED LEVELS OF CHOLESTEROL?  Total cholesterol below 200.   LDL below 100 for people at risk, below 70 for those at very high risk.   HDL above 50 is good, above 60 is best.   Triglycerides below 150.  HOW CAN I LOWER MY CHOLESTEROL?  Diet. Follow your diet programs as directed by your health care provider.   Choose fish or white meat chicken and [place], roasted or baked. Limit fatty cuts of red meat, fried foods, and  processed meats, such as sausage and lunch meats.   Eat lots of fresh fruits and vegetables.  Choose whole grains, beans, pasta, potatoes, and cereals.   Use only small amounts of olive, corn, or canola oils.   Avoid butter, mayonnaise, shortening, or palm kernel oils.  Avoid foods with trans fats.   Drink skim or nonfat milk and eat low-fat or nonfat yogurt and cheeses. Avoid whole milk, cream, ice cream, egg yolks, and full-fat cheeses.   Healthy desserts include angel food cake, ginger snaps, animal crackers, hard candy, popsicles, and low-fat or nonfat frozen yogurt. Avoid pastries, cakes, pies, and cookies.   Exercise. Follow your exercise programs as directed by your health care provider.   A regular program helps decrease LDL and raise HDL.   A regular program helps with weight control.   Do things that increase your activity level like gardening, walking, or taking the stairs. Ask your health care provider about how you can be more active in your daily life.   Medicine. Take medicine only as directed by your health care provider.   Medicine may be prescribed by your health care provider to help lower cholesterol and decrease the risk for heart disease.   If you have several risk factors, you may need medicine even if your levels are normal.   This information is  not intended to replace advice given to you by your health care provider. Make sure you discuss any questions you have with your health care provider.   Document Released: 11/28/2000 Document Revised: 03/26/2014 Document Reviewed: 12/17/2012 Elsevier Interactive Patient Education 2016 [place] for a Low Cholesterol, Low Saturated Fat Diet   Fats - Limit total intake of fats and oils. - Avoid butter, stick margarine, shortening, lard, palm and coconut oils. - Limit mayonnaise, salad dressings, gravies and sauces, unless they are homemade with low-fat ingredients. - Limit  chocolate. - Choose low-fat and nonfat products, such as low-fat mayonnaise, low-fat or non-hydrogenated peanut butter, low-fat or fat-free salad dressings and nonfat gravy. - Use vegetable oil, such as canola or olive oil. - Look for margarine that does not contain trans fatty acids. - Use nuts in moderate amounts. - Read ingredient labels carefully to determine both amount and type of fat present in foods. Limit saturated and trans fats! - Avoid high-fat processed and convenience foods.  Meats and Meat Alternatives - Choose fish, chicken, [place] and lean meats. - Use dried beans, peas, lentils and tofu. - Limit egg yolks to three to four per week. - If you eat red meat, limit to no more than three servings per week and choose loin or round cuts. - Avoid fatty meats, such as bacon, sausage, franks, luncheon meats and ribs. - Avoid all organ meats, including liver.  Dairy - Choose nonfat or low-fat milk, yogurt and cottage cheese. - Most cheeses are high in fat. Choose cheeses made from non-fat milk, such as mozzarella and ricotta cheese. - Choose light or fat-free cream cheese and sour cream. - Avoid cream and sauces made with cream.  Fruits and Vegetables - Eat a wide variety of fruits and vegetables. - Use lemon juice, vinegar or "mist" olive oil on vegetables. - Avoid adding sauces, fat or oil to vegetables.  Breads, Cereals and Grains - Choose whole-grain breads, cereals, pastas and rice. - Avoid high-fat snack foods, such as granola, cookies, pies, pastries, doughnuts and croissants.  Cooking Tips - Avoid deep fried foods. - Trim visible fat off meats and remove skin from poultry before cooking. - Bake, broil, boil, poach or roast poultry, fish and lean meats. - Drain and discard fat that drains out of meat as you cook it. - Add little or no fat to foods. - Use vegetable oil sprays to grease pans for cooking or baking. - Steam vegetables. - Use herbs or no-oil marinades  to flavor foods.   Prediabetes Eating Plan Prediabetes--also called impaired glucose tolerance or impaired fasting glucose--is a condition that causes blood sugar (blood glucose) levels to be higher than normal. Following a healthy diet can help to keep prediabetes under control. It can also help to lower the risk of type 2 diabetes and heart disease, which are increased in people who have prediabetes. Along with regular exercise, a healthy diet:  Promotes weight loss.  Helps to control blood sugar levels.  Helps to improve the way that the body uses insulin. WHAT DO I NEED TO KNOW ABOUT THIS EATING PLAN?  Use the glycemic index (GI) to plan your meals. The index tells you how quickly a food will raise your blood sugar. Choose low-GI foods. These foods take a longer time to raise blood sugar.  Pay close attention to the amount of carbohydrates in the food that you eat. Carbohydrates increase blood sugar levels.  Keep track of how many calories you take  in. Eating the right amount of calories will help you to achieve a healthy weight. Losing about 7 percent of your starting weight can help to prevent type 2 diabetes.  You may want to follow a Mediterranean diet. This diet includes a lot of vegetables, lean meats or fish, whole grains, fruits, and healthy oils and fats. WHAT FOODS CAN I EAT? Grains Whole grains, such as whole-wheat or whole-grain breads, crackers, cereals, and pasta. Unsweetened oatmeal. Bulgur. Barley. Quinoa. Brown rice. Corn or whole-wheat flour tortillas or taco shells. Vegetables Lettuce. Spinach. Peas. Beets. Cauliflower. Cabbage. Broccoli. Carrots. Tomatoes. Squash. Eggplant. Herbs. Peppers. Onions. Cucumbers. Brussels sprouts. Fruits Berries. Bananas. Apples. Oranges. Grapes. Papaya. Mango. Pomegranate. Kiwi. Grapefruit. Cherries. Meats and Other Protein Sources Seafood. Lean meats, such as chicken and [place] or lean cuts of pork and beef. Tofu. Eggs. Nuts.  Beans. Dairy Low-fat or fat-free dairy products, such as yogurt, cottage cheese, and cheese. Beverages Water. Tea. Coffee. Sugar-free or diet soda. Seltzer water. Milk. Milk alternatives, such as soy or almond milk. Condiments Mustard. Relish. Low-fat, low-sugar ketchup. Low-fat, low-sugar barbecue sauce. Low-fat or fat-free mayonnaise. Sweets and Desserts Sugar-free or low-fat pudding. Sugar-free or low-fat ice cream and other frozen treats. Fats and Oils Avocado. Walnuts. Olive oil. The items listed above may not be a complete list of recommended foods or beverages. Contact your dietitian for more options.  WHAT FOODS ARE NOT RECOMMENDED? Grains Refined white flour and flour products, such as bread, pasta, snack foods, and cereals. Beverages Sweetened drinks, such as sweet iced tea and soda. Sweets and Desserts Baked goods, such as cake, cupcakes, pastries, cookies, and cheesecake. The items listed above may not be a complete list of foods and beverages to avoid. Contact your dietitian for more information.   This information is not intended to replace advice given to you by your health care provider. Make sure you discuss any questions you have with your health care provider.   Document Released: 07/20/2014 Document Reviewed: 07/20/2014 Elsevier Interactive Patient Education [employer].

## 2015-08-26 NOTE — Progress Notes (Signed)
Subjective:    CC:   HPI: Andrew Blair is a 50 y.o. male who presents to [place] at [facility] today To review lab work and have a discussion about treatment plan modifications.   Patient has no acute complaints today.     Past Medical History  Diagnosis Date  . Thyroid disease     hypothyroid  . GERD (gastroesophageal reflux disease)   . Fatigue     discomfort from hemorrhoids leading to lack of sleep and fatigue   . Allergy   . Anxiety   . Substance abuse     Past Surgical History  Procedure Laterality Date  . Skin surgery  1984    skin graph surgery on left hand   . Hand surgery  1984    Family History  Problem Relation Age of Onset  . Hypertension Mother   . Heart murmur Mother   . Kidney disease Father     stones  . Heart disease Father   . Hyperlipidemia Father   . Cancer Maternal Grandmother   . Heart disease Maternal Grandfather   . Heart disease Paternal Grandmother   . Heart disease Paternal Grandfather     History  Drug Use No  ,  History  Alcohol Use  . 1.5 oz/week  . 3 Standard drinks or equivalent per week  ,  History  Smoking status  . Former Smoker -- 0.50 packs/day for 30 years  Smokeless tobacco  . Current User  ,  History  Sexual Activity  . Sexual Activity: Not on file    Current Outpatient Prescriptions on File Prior to Visit  Medication Sig Dispense Refill  . albuterol (PROVENTIL HFA;VENTOLIN HFA) 108 (90 Base) MCG/ACT inhaler Inhale 2 puffs into the lungs every 6 (six) hours as needed for wheezing. 2 Inhaler 11  . ibuprofen (ADVIL,MOTRIN) 600 MG tablet Take 600 mg by mouth every 6 (six) hours as needed for moderate pain.    Marland Kitchen levothyroxine (SYNTHROID, LEVOTHROID) 150 MCG tablet Take 150 mcg by mouth daily before breakfast.    . polyethylene glycol (MIRALAX / GLYCOLAX) packet Take 17 g by mouth daily as needed for mild constipation.     No current facility-administered medications on file prior  to visit.    No Known Allergies   Review of Systems: General:  Denies fever, chills, appetite changes, unexplained weight loss.  Respiratory: Denies SOB, DOE, cough, wheezing.  Cardiovascular: Denies chest pain, palpitations.  Gastrointestinal: Denies nausea, vomiting, diarrhea, abdominal pain.  Genitourinary: Denies dysuria, increased frequency, flank pain. Endocrine: Denies hot or cold intolerance, polyuria, polydipsia. Musculoskeletal: Denies myalgias, back pain, joint swelling, arthralgias, gait problems.  Skin: Denies pallor, rash, suspicious lesions.  Neurological: Denies dizziness, seizures, syncope, unexplained weakness, lightheadedness, numbness and headaches.  Psychiatric/Behavioral: Denies mood changes, suicidal or homicidal ideations, hallucinations, sleep disturbances.   Objective:     Filed Vitals:   08/26/15 0911  Height: 6' 2.5" (1.892 m)  Weight: 214 lb 14.4 oz (97.478 kg)   Blood pressure 142/81, pulse 71, height 6' 2.5" (1.892 m), weight 214 lb 14.4 oz (97.478 kg). Body mass index is 27.23 kg/(m^2). General: Well Developed, well nourished, and in no acute distress.  HEENT: Normocephalic, atraumatic, pupils equal round reactive to light, neck supple, No carotid bruits no JVD Skin: Warm and dry, cap RF less 2 sec Cardiac: Regular rate and rhythm, S1, S2 WNL's, no murmurs rubs or gallops Respiratory: ECTA B/L, Not using accessory muscles,  speaking in full sentences. NeuroM-Sk: Ambulates w/o assistance, moves ext * 4 w/o difficulty, sensation grossly intact.  Psych: A and O *3, judgement and insight good.   Recent Results (from the past 2160 hour(s))  CBC w/Diff     Status: None   Collection Time: 08/19/15  9:21 AM  Result Value Ref Range   WBC 4.8 3.8 - 10.8 K/uL   RBC 4.91 4.20 - 5.80 MIL/uL   Hemoglobin 14.7 13.2 - 17.1 g/dL   HCT 44.5 38.5 - 50.0 %   MCV 90.6 80.0 - 100.0 fL   MCH 29.9 27.0 - 33.0 pg   MCHC 33.0 32.0 - 36.0 g/dL   RDW 14.8 11.0 -  15.0 %   Platelets 238 140 - 400 K/uL   MPV 10.2 7.5 - 12.5 fL   Neutro Abs 2736 1500 - 7800 cells/uL   Lymphs Abs 1440 850 - 3900 cells/uL   Monocytes Absolute 384 200 - 950 cells/uL   Eosinophils Absolute 192 15 - 500 cells/uL   Basophils Absolute 48 0 - 200 cells/uL   Neutrophils Relative % 57 %   Lymphocytes Relative 30 %   Monocytes Relative 8 %   Eosinophils Relative 4 %   Basophils Relative 1 %   Smear Review Criteria for review not met     Comment: ** Please note change in unit of measure and reference range(s). **  Comp Met (CMET)     Status: None   Collection Time: 08/19/15  9:21 AM  Result Value Ref Range   Sodium 140 135 - 146 mmol/L   Potassium 4.3 3.5 - 5.3 mmol/L   Chloride 104 98 - 110 mmol/L   CO2 26 20 - 31 mmol/L   Glucose, Bld 96 65 - 99 mg/dL   BUN 19 7 - 25 mg/dL   Creat 0.97 0.60 - 1.35 mg/dL   Total Bilirubin 0.5 0.2 - 1.2 mg/dL   Alkaline Phosphatase 45 40 - 115 U/L   AST 32 10 - 40 U/L   ALT 40 9 - 46 U/L   Total Protein 6.8 6.1 - 8.1 g/dL   Albumin 4.4 3.6 - 5.1 g/dL   Calcium 9.5 8.6 - 10.3 mg/dL  HgB A1c     Status: Abnormal   Collection Time: 08/19/15  9:21 AM  Result Value Ref Range   Hgb A1c MFr Bld 5.8 (H) <5.7 %    Comment:   For someone without known diabetes, a hemoglobin A1c value between 5.7% and 6.4% is consistent with prediabetes and should be confirmed with a follow-up test.   For someone with known diabetes, a value <7% indicates that their diabetes is well controlled. A1c targets should be individualized based on duration of diabetes, age, co-morbid conditions and other considerations.   This assay result is consistent with an increased risk of diabetes.   Currently, no consensus exists regarding use of hemoglobin A1c for diagnosis of diabetes in children.      Mean Plasma Glucose 120 mg/dL  TSH     Status: Abnormal   Collection Time: 08/19/15  9:21 AM  Result Value Ref Range   TSH 7.92 (H) 0.40 - 4.50 mIU/L  PSA      Status: None   Collection Time: 08/19/15  9:21 AM  Result Value Ref Range   PSA 0.88 <=4.00 ng/mL    Comment: Test Methodology: ECLIA PSA (Electrochemiluminescence Immunoassay)   For PSA values from 2.5-4.0, particularly in younger men <7 years old, the AUA  and NCCN suggest testing for % Free PSA (3515) and evaluation of the rate of increase in PSA (PSA velocity).   Vitamin D (25 hydroxy)     Status: Abnormal   Collection Time: 08/19/15  9:21 AM  Result Value Ref Range   Vit D, 25-Hydroxy 24 (L) 30 - 100 ng/mL    Comment: Vitamin D Status           25-OH Vitamin D        Deficiency                <20 ng/mL        Insufficiency         20 - 29 ng/mL        Optimal             > or = 30 ng/mL   For 25-OH Vitamin D testing on patients on D2-supplementation and patients for whom quantitation of D2 and D3 fractions is required, the QuestAssureD 25-OH VIT D, (D2,D3), LC/MS/MS is recommended: order code [id number] (patients > 2 yrs).   Lipid panel     Status: Abnormal   Collection Time: 08/19/15  9:21 AM  Result Value Ref Range   Cholesterol 287 (H) 125 - 200 mg/dL   Triglycerides 274 (H) <150 mg/dL   HDL 58 >=40 mg/dL   Total CHOL/HDL Ratio 4.9 <=5.0 Ratio   VLDL 55 (H) <30 mg/dL   LDL Cholesterol 174 (H) <130 mg/dL    Comment:   Total Cholesterol/HDL Ratio:CHD Risk                        Coronary Heart Disease Risk Table                                        Men       Women          1/2 Average Risk              3.4        3.3              Average Risk              5.0        4.4           2X Average Risk              9.6        7.1           3X Average Risk             23.4       11.0 Use the calculated Patient Ratio above and the CHD Risk table  to determine the patient's CHD Risk.     Impression and Recommendations:    There are no diagnoses linked to this encounter.  - add fish oil supp. - start lipitor low dose b/c 5.3 % 10 yr risk and FAM HX -Pt will let me know  about dose of thyroid meds so we can change dose and inc. Need reck TSH 6 wks - purdent diet and exercise d/c pt - 1/2 weight in ounces of water per day - vit D3 5,000 IU per day- every day   Hypothyroidism -Pt is not absolute certain the exact micrograms of his medicine.  He will let me  know about dose of thyroid meds so we can change dose and inc.  I discussed with patient what an elevated TSH means. Patient is currently asymptomatic.   patient told me he will call back later on today and tell us the exact dose he is on.   Patient understands we will need reck TSH 6 wks with any change in dosage  HLD (hyperlipidemia) Long discussion with patient about risks benefits of statin.   Long discussion about 10 year atherosclerotic cardiovascular risk estimator in his risk being at 5.3% over the next 10 years with chance of having his first heart attack or stroke.  After discussion and patient with family history of male heart disease at later ages (after age 67), Patient desires to go on statin at this time.    Cholesterol prudent diet discussed and handouts provided. Advised exercise of 30 minutes at least 5 days per week.  Asked patient to drink adequate amounts of water including half of his weight in ounces of water  Acute gross stress reaction Patient currently doing well, stable  GERD (gastroesophageal reflux disease) Symptoms stable. No acute complaints.  Prediabetes Long discussion with patient of what glucose intolerance means.    Recommend diet modification and discussed carbohydrates, good (fiber ) and bad (white flour products, processed foods).   Exercise 30 minutes of moderate intensity 5+ days per week. Recheck A1c in 4-6 months.  Vitamin D deficiency Patient prefers to take a daily dose of 5K IUs vitamin D3 over-the-counter. Patient knows he needs to go pick this up and take it daily. Told him we will recheck levels in 4-6 months.    Gross side effects, risk and benefits, and  alternatives of medications discussed with patient.  Patient is aware that all medications have potential side effects and we are unable to predict every sideeffect or drug-drug interaction that may occur.  Expresses verbal understanding and consents to current therapy plan and treatment regiment.  Note: This document was prepared using Dragon voice recognition software and may include unintentional dictation errors.

## 2015-08-29 DIAGNOSIS — E079 Disorder of thyroid, unspecified: Secondary | ICD-10-CM | POA: Insufficient documentation

## 2015-08-29 DIAGNOSIS — E785 Hyperlipidemia, unspecified: Secondary | ICD-10-CM | POA: Insufficient documentation

## 2015-08-29 DIAGNOSIS — R7303 Prediabetes: Secondary | ICD-10-CM | POA: Insufficient documentation

## 2015-08-29 DIAGNOSIS — K219 Gastro-esophageal reflux disease without esophagitis: Secondary | ICD-10-CM | POA: Insufficient documentation

## 2015-08-29 DIAGNOSIS — E782 Mixed hyperlipidemia: Secondary | ICD-10-CM | POA: Insufficient documentation

## 2015-08-29 DIAGNOSIS — E559 Vitamin D deficiency, unspecified: Secondary | ICD-10-CM | POA: Insufficient documentation

## 2015-08-29 NOTE — Assessment & Plan Note (Signed)
Patient prefers to take a daily dose of 5K IUs vitamin D3 over-the-counter. Patient knows he needs to go pick this up and take it daily. Told him we will recheck levels in 4-6 months.

## 2015-08-29 NOTE — Assessment & Plan Note (Signed)
Symptoms stable. No acute complaints.

## 2015-08-29 NOTE — Assessment & Plan Note (Signed)
Long discussion with patient of what glucose intolerance means.    Recommend diet modification and discussed carbohydrates, good (fiber ) and bad (white flour products, processed foods).   Exercise 30 minutes of moderate intensity 5+ days per week. Recheck A1c in 4-6 months.

## 2015-08-29 NOTE — Assessment & Plan Note (Addendum)
Long discussion with patient about risks benefits of statin.   Long discussion about 10 year atherosclerotic cardiovascular risk estimator in his risk being at 5.3% over the next 10 years with chance of having his first heart attack or stroke.  After discussion and patient with family history of male heart disease at later ages (after age 49), Patient desires to go on statin at this time.    Cholesterol prudent diet discussed and handouts provided. Advised exercise of 30 minutes at least 5 days per week.  Asked patient to drink adequate amounts of water including half of his weight in ounces of water

## 2015-08-29 NOTE — Assessment & Plan Note (Signed)
Patient currently doing well, stable

## 2015-08-29 NOTE — Assessment & Plan Note (Addendum)
-  Pt is not absolute certain the exact micrograms of his medicine.  He will let me know about dose of thyroid meds so we can change dose and inc.  I discussed with patient what an elevated TSH means. Patient is currently asymptomatic.   patient told me he will call back later on today and tell us the exact dose he is on.   Patient understands we will need reck TSH 6 wks with any change in dosage

## 2015-09-05 DIAGNOSIS — Z87891 Personal history of nicotine dependence: Secondary | ICD-10-CM | POA: Insufficient documentation

## 2015-09-07 ENCOUNTER — Encounter: Payer: Self-pay | Admitting: Family Medicine

## 2015-09-08 ENCOUNTER — Encounter: Payer: Self-pay | Admitting: Family Medicine

## 2015-09-08 ENCOUNTER — Ambulatory Visit (INDEPENDENT_AMBULATORY_CARE_PROVIDER_SITE_OTHER): Payer: 59 | Admitting: Family Medicine

## 2015-09-08 VITALS — BP 102/68 | HR 64 | Temp 97.8°F | Ht 74.5 in | Wt 210.4 lb

## 2015-09-08 DIAGNOSIS — K219 Gastro-esophageal reflux disease without esophagitis: Secondary | ICD-10-CM

## 2015-09-08 DIAGNOSIS — E785 Hyperlipidemia, unspecified: Secondary | ICD-10-CM

## 2015-09-08 DIAGNOSIS — L03011 Cellulitis of right finger: Secondary | ICD-10-CM | POA: Diagnosis not present

## 2015-09-08 DIAGNOSIS — E079 Disorder of thyroid, unspecified: Secondary | ICD-10-CM | POA: Diagnosis not present

## 2015-09-08 DIAGNOSIS — R7303 Prediabetes: Secondary | ICD-10-CM | POA: Diagnosis not present

## 2015-09-08 MED ORDER — MUPIROCIN 2 % EX OINT: TOPICAL_OINTMENT | CUTANEOUS | Status: DC

## 2015-09-08 MED ORDER — LEVOTHYROXINE SODIUM 200 MCG PO TABS: ORAL_TABLET | ORAL | Status: DC

## 2015-09-08 NOTE — Assessment & Plan Note (Signed)
Patient's reflux symptoms have been nonexistent since his dietary changes.

## 2015-09-08 NOTE — Assessment & Plan Note (Signed)
Patient is doing wonderfully on his diet Coralyn Mark and lifestyle modifications. He has lost 7 pounds since last seen. He is given up sugar and white flour products.  He has a follow-up in the near future.

## 2015-09-08 NOTE — Progress Notes (Signed)
Subjective:    CC: infection finger  HPI: Andrew Blair is a 50 y.o. male who presents to [place] at [facility] today for pain that started in his right thumb last night.   During the evening the pain got worse. This morning he woke up and entire distal thumb was red and "sausage-looking".   There also appeared to be a pus pocket on the side of his finger. After patient took a shower this morning-->  It was so tender and enlarged/ swollen, he pushed pus out of it when he pushed on the lateral aspect of his thumb. Patient is extra sensitive about this because he had a father in law that almost died from a similar nail infection which he ignored.  SInce we last D/c pt his pre-DM, elevated HLD etc- he has Lost 7 lbs since last saw him, more veggies, fish, fruits, no white flour products.  More chicken, [place] fish, no sugar, changed to sugar free grape jelly, truvia etc.   Thyroid- still no sx, confirmed he is taking 148mg  Now not needing GERD meds b/c dietary changes he has made.     Past Medical History  Diagnosis Date  . Thyroid disease     hypothyroid  . GERD (gastroesophageal reflux disease)   . Fatigue     discomfort from hemorrhoids leading to lack of sleep and fatigue   . Allergy   . Anxiety   . Substance abuse     Past Surgical History  Procedure Laterality Date  . Skin surgery  1984    skin graph surgery on left hand   . Hand surgery  1984    Family History  Problem Relation Age of Onset  . Hypertension Mother   . Heart murmur Mother   . Kidney disease Father     stones  . Heart disease Father   . Hyperlipidemia Father   . Cancer Maternal Grandmother   . Heart disease Maternal Grandfather   . Heart disease Paternal Grandmother   . Heart disease Paternal Grandfather     History  Drug Use No  ,  History  Alcohol Use  . 1.5 oz/week  . 3 Standard drinks or equivalent per week  ,  History  Smoking status  . Former Smoker --  0.50 packs/day for 30 years  Smokeless tobacco  . Current User  ,  History  Sexual Activity  . Sexual Activity: Not on file    Current Outpatient Prescriptions on File Prior to Visit  Medication Sig Dispense Refill  . albuterol (PROVENTIL HFA;VENTOLIN HFA) 108 (90 Base) MCG/ACT inhaler Inhale 2 puffs into the lungs every 6 (six) hours as needed for wheezing. 2 Inhaler 11  . atorvastatin (LIPITOR) 20 MG tablet Take 1 tablet (20 mg total) by mouth daily. 90 tablet 3  . ibuprofen (ADVIL,MOTRIN) 600 MG tablet Take 600 mg by mouth every 6 (six) hours as needed for moderate pain.    .Marland Kitchenlevothyroxine (SYNTHROID, LEVOTHROID) 175 MCG tablet Take 175 mcg by mouth daily before breakfast.    . polyethylene glycol (MIRALAX / GLYCOLAX) packet Take 17 g by mouth daily as needed for mild constipation.     No current facility-administered medications on file prior to visit.    No Known Allergies   Review of Systems:  ( Completed via her adult medical history intake form today ) General:  Denies fever, chills, appetite changes, unexplained weight loss.  Respiratory: Denies SOB, DOE,  cough, wheezing.  Cardiovascular: Denies chest pain, palpitations.  Gastrointestinal: Denies nausea, vomiting, diarrhea, abdominal pain.  Genitourinary: Denies dysuria, increased frequency, flank pain. Endocrine: Denies hot or cold intolerance, polyuria, polydipsia. Musculoskeletal: Denies myalgias, back pain, joint swelling, arthralgias, gait problems.  Skin: Denies pallor, rash, suspicious lesions.  Neurological: Denies dizziness, seizures, syncope, unexplained weakness, lightheadedness, numbness and headaches.  Psychiatric/Behavioral: Denies mood changes, suicidal or homicidal ideations, hallucinations, sleep disturbances.   Objective:     Filed Vitals:   09/08/15 1326  Height: 6' 2.5" (1.892 m)  Weight: 210 lb 6.4 oz (95.437 kg)   Blood pressure 102/68, pulse 64, temperature 97.8 F (36.6 C), temperature  source Oral, height 6' 2.5" (1.892 m), weight 210 lb 6.4 oz (95.437 kg). Body mass index is 26.66 kg/(m^2). General: Well Developed, well nourished, and in no acute distress.  HEENT: Normocephalic, atraumatic, pupils equal round reactive to light, neck supple, No carotid bruits no JVD Skin: Warm and dry, cap RF less 2 sec, R Thumb, lateral border of nail fold- skin erythema/ edematous and TTP.   Cardiac: Regular rate Respiratory: Not using accessory muscles, speaking in full sentences. NeuroM-Sk: Ambulates w/o assistance, moves ext * 4 w/o difficulty, sensation grossly intact.  Psych: A and O *3, judgement and insight good.   Recent Results (from the past 2160 hour(s))  CBC w/Diff     Status: None   Collection Time: 08/19/15  9:21 AM  Result Value Ref Range   WBC 4.8 3.8 - 10.8 K/uL   RBC 4.91 4.20 - 5.80 MIL/uL   Hemoglobin 14.7 13.2 - 17.1 g/dL   HCT 44.5 38.5 - 50.0 %   MCV 90.6 80.0 - 100.0 fL   MCH 29.9 27.0 - 33.0 pg   MCHC 33.0 32.0 - 36.0 g/dL   RDW 14.8 11.0 - 15.0 %   Platelets 238 140 - 400 K/uL   MPV 10.2 7.5 - 12.5 fL   Neutro Abs 2736 1500 - 7800 cells/uL   Lymphs Abs 1440 850 - 3900 cells/uL   Monocytes Absolute 384 200 - 950 cells/uL   Eosinophils Absolute 192 15 - 500 cells/uL   Basophils Absolute 48 0 - 200 cells/uL   Neutrophils Relative % 57 %   Lymphocytes Relative 30 %   Monocytes Relative 8 %   Eosinophils Relative 4 %   Basophils Relative 1 %   Smear Review Criteria for review not met     Comment: ** Please note change in unit of measure and reference range(s). **  Comp Met (CMET)     Status: None   Collection Time: 08/19/15  9:21 AM  Result Value Ref Range   Sodium 140 135 - 146 mmol/L   Potassium 4.3 3.5 - 5.3 mmol/L   Chloride 104 98 - 110 mmol/L   CO2 26 20 - 31 mmol/L   Glucose, Bld 96 65 - 99 mg/dL   BUN 19 7 - 25 mg/dL   Creat 0.97 0.60 - 1.35 mg/dL   Total Bilirubin 0.5 0.2 - 1.2 mg/dL   Alkaline Phosphatase 45 40 - 115 U/L   AST 32 10 -  40 U/L   ALT 40 9 - 46 U/L   Total Protein 6.8 6.1 - 8.1 g/dL   Albumin 4.4 3.6 - 5.1 g/dL   Calcium 9.5 8.6 - 10.3 mg/dL  HgB A1c     Status: Abnormal   Collection Time: 08/19/15  9:21 AM  Result Value Ref Range   Hgb A1c MFr Bld  5.8 (H) <5.7 %    Comment:   For someone without known diabetes, a hemoglobin A1c value between 5.7% and 6.4% is consistent with prediabetes and should be confirmed with a follow-up test.   For someone with known diabetes, a value <7% indicates that their diabetes is well controlled. A1c targets should be individualized based on duration of diabetes, age, co-morbid conditions and other considerations.   This assay result is consistent with an increased risk of diabetes.   Currently, no consensus exists regarding use of hemoglobin A1c for diagnosis of diabetes in children.      Mean Plasma Glucose 120 mg/dL  TSH     Status: Abnormal   Collection Time: 08/19/15  9:21 AM  Result Value Ref Range   TSH 7.92 (H) 0.40 - 4.50 mIU/L  PSA     Status: None   Collection Time: 08/19/15  9:21 AM  Result Value Ref Range   PSA 0.88 <=4.00 ng/mL    Comment: Test Methodology: ECLIA PSA (Electrochemiluminescence Immunoassay)   For PSA values from 2.5-4.0, particularly in younger men <39 years old, the AUA and NCCN suggest testing for % Free PSA (3515) and evaluation of the rate of increase in PSA (PSA velocity).   Vitamin D (25 hydroxy)     Status: Abnormal   Collection Time: 08/19/15  9:21 AM  Result Value Ref Range   Vit D, 25-Hydroxy 24 (L) 30 - 100 ng/mL    Comment: Vitamin D Status           25-OH Vitamin D        Deficiency                <20 ng/mL        Insufficiency         20 - 29 ng/mL        Optimal             > or = 30 ng/mL   For 25-OH Vitamin D testing on patients on D2-supplementation and patients for whom quantitation of D2 and D3 fractions is required, the QuestAssureD 25-OH VIT D, (D2,D3), LC/MS/MS is recommended: order code [id number]  (patients > 2 yrs).   Lipid panel     Status: Abnormal   Collection Time: 08/19/15  9:21 AM  Result Value Ref Range   Cholesterol 287 (H) 125 - 200 mg/dL   Triglycerides 274 (H) <150 mg/dL   HDL 58 >=40 mg/dL   Total CHOL/HDL Ratio 4.9 <=5.0 Ratio   VLDL 55 (H) <30 mg/dL   LDL Cholesterol 174 (H) <130 mg/dL    Comment:   Total Cholesterol/HDL Ratio:CHD Risk                        Coronary Heart Disease Risk Table                                        Men       Women          1/2 Average Risk              3.4        3.3              Average Risk              5.0  4.4           2X Average Risk              9.6        7.1           3X Average Risk             23.4       11.0 Use the calculated Patient Ratio above and the CHD Risk table  to determine the patient's CHD Risk.     Impression and Recommendations:    There are no diagnoses linked to this encounter.  Hypothyroidism Patient confirmed he was on 175 g of Synthroid prior. Due to his elevated TSH as of late, we will increase dose every other day. He will take 175, then 200 g, then 175, then 200 etc.  Prediabetes Patient is doing wonderfully on his diet Coralyn Mark and lifestyle modifications. He has lost 7 pounds since last seen. He is given up sugar and white flour products.  He has a follow-up in the near future.  HLD (hyperlipidemia) Patient is tolerating Lipitor very well. No side effects. Trying to drink more water.   Patient is trying to eat a Mediterranean diet and has given up on red meat. He is eating much more fish, chicken and [place]. He is given up on fried foods. He is trying to avoid milk and cheese products which have a lot of saturated and Transfats. Congratulated patient on his efforts today! Doing great  Paronychia of finger of right hand Since patient had it drained this morning after getting out of the shower, encouraged to continue warm water and I'll soap soaks. Mupirocin ointment daily. If not at risk  of getting dirty, keep it open to air.  Red flag symptoms discussed with patient. Follow-up when necessary  GERD (gastroesophageal reflux disease) Patient's reflux symptoms have been nonexistent since his dietary changes.  Gross side effects, risk and benefits, and alternatives of medications discussed with patient.  Patient is aware that all medications have potential side effects and we are unable to predict every sideeffect or drug-drug interaction that may occur.  Expresses verbal understanding and consents to current therapy plan and treatment regiment.  Note: This document was prepared using Dragon voice recognition software and may include unintentional dictation errors.

## 2015-09-08 NOTE — Assessment & Plan Note (Signed)
Patient confirmed he was on 175 g of Synthroid prior. Due to his elevated TSH as of late, we will increase dose every other day. He will take 175, then 200 g, then 175, then 200 etc.

## 2015-09-08 NOTE — Patient Instructions (Signed)
Do warm water soaks 3-4 times per day, use bactroban ointment immediately after and use bandaid if out in dirty places.   Synthroid- use 200 mcg then the 175 every other day, RECK 6-8wks

## 2015-09-08 NOTE — Assessment & Plan Note (Addendum)
Patient is tolerating Lipitor very well. No side effects. Trying to drink more water.   Patient is trying to eat a Mediterranean diet and has given up on red meat. He is eating much more fish, chicken and [place]. He is given up on fried foods. He is trying to avoid milk and cheese products which have a lot of saturated and Transfats. Congratulated patient on his efforts today! Doing great

## 2015-09-08 NOTE — Assessment & Plan Note (Signed)
Since patient had it drained this morning after getting out of the shower, encouraged to continue warm water and I'll soap soaks. Mupirocin ointment daily. If not at risk of getting dirty, keep it open to air.  Red flag symptoms discussed with patient. Follow-up when necessary

## 2015-09-13 ENCOUNTER — Encounter: Payer: Self-pay | Admitting: Family Medicine

## 2015-09-13 DIAGNOSIS — R5383 Other fatigue: Secondary | ICD-10-CM | POA: Insufficient documentation

## 2015-10-08 DIAGNOSIS — E781 Pure hyperglyceridemia: Secondary | ICD-10-CM | POA: Insufficient documentation

## 2015-10-14 ENCOUNTER — Encounter (INDEPENDENT_AMBULATORY_CARE_PROVIDER_SITE_OTHER): Payer: 59 | Admitting: Family Medicine

## 2015-10-14 DIAGNOSIS — R7303 Prediabetes: Secondary | ICD-10-CM | POA: Diagnosis not present

## 2015-10-14 DIAGNOSIS — F430 Acute stress reaction: Secondary | ICD-10-CM | POA: Diagnosis not present

## 2015-10-14 DIAGNOSIS — E079 Disorder of thyroid, unspecified: Secondary | ICD-10-CM

## 2015-10-14 DIAGNOSIS — E559 Vitamin D deficiency, unspecified: Secondary | ICD-10-CM

## 2015-10-14 DIAGNOSIS — E782 Mixed hyperlipidemia: Secondary | ICD-10-CM | POA: Diagnosis not present

## 2015-10-14 NOTE — Progress Notes (Deleted)
F/up OV: Impression and Recommendations:    1. Hypothyroidism   2. Prediabetes   3. HLD (hyperlipidemia)   4. Acute gross stress reaction   5. Vitamin D deficiency      We will check thyroid levels today.  Patient's Medications  New Prescriptions   No medications on file  Previous Medications   ALBUTEROL (PROVENTIL HFA;VENTOLIN HFA) 108 (90 BASE) MCG/ACT INHALER    Inhale 2 puffs into the lungs every 6 (six) hours as needed for wheezing.   ATORVASTATIN (LIPITOR) 20 MG TABLET    Take 1 tablet (20 mg total) by mouth daily.   CHOLECALCIFEROL (VITAMIN D) 1000 UNITS TABLET    Take 5,000 Units by mouth daily.   IBUPROFEN (ADVIL,MOTRIN) 600 MG TABLET    Take 600 mg by mouth every 6 (six) hours as needed for moderate pain.   LEVOTHYROXINE (SYNTHROID) 200 MCG TABLET    Use 167mcg one day, then 226mcg the next and switch doses QOD   LEVOTHYROXINE (SYNTHROID, LEVOTHROID) 175 MCG TABLET    Take 175 mcg by mouth daily before breakfast.   MUPIROCIN OINTMENT (BACTROBAN) 2 %    Apply to affected area TID for 7 days.   OMEGA-3 FATTY ACIDS (FISH OIL) 1000 MG CPDR    Take 1 capsule by mouth daily.   POLYETHYLENE GLYCOL (MIRALAX / GLYCOLAX) PACKET    Take 17 g by mouth daily as needed for mild constipation.  Modified Medications   No medications on file  Discontinued Medications   No medications on file    Return in about 4 months (around 02/14/2016) for Follow-up of current medical issues.  The patient was counseled, risk factors were discussed, anticipatory guidance given.  Gross side effects, risk and benefits, and alternatives of medications discussed with patient.  Patient is aware that all medications have potential side effects and we are unable to predict every side effect or drug-drug interaction that may occur.  Expresses verbal understanding and consents to current therapy plan and treatment regimen.  Please see AVS handed out to patient at the end of our visit for further  patient instructions/ counseling done pertaining to today's office visit.    Note: This document was prepared using Dragon voice recognition software and may include unintentional dictation errors.   --------------------------------------------------------------------------------------------------------------------------------------------------------------------------------------------------------------------------------------------    Subjective:    CC: No chief complaint on file.   HPI: Andrew Blair is a 50 y.o. male who presents to [place] at [facility] today for issues as discussed below.    Hypothyroidism: Approximate 6 weeks ago we changed patient's thyroid dose, increasing it slightly.  He was told to take 200 g one day, and 175 the next.  He has tolerated this dose well. Does not feel different.   Needs recheck of his thyroid levels today.    Recently diagnosed with prediabetes: Still eating very low white flour products, and low bread.  With these dietary changes, his IBS symptoms have continued to improve    Mixed hyperlipidemia: About 2 months ago, we started patient on Lipitor.    At that time his triglycerides were 274, VLDL 55, LDL 174. His HDL was 58.   I advised him not only prudent diet but to increase activity level and exercise more.    Vitamin D deficiency:  He opted to take daily vitamin D over a weekly dose.  Feeling better.   Patient Active Problem List   Diagnosis Date Noted  . Fatigue 09/13/2015  .  Paronychia of finger of right hand 09/08/2015  . HLD (hyperlipidemia) 08/29/2015  . Prediabetes 08/29/2015  . Vitamin D deficiency 08/29/2015  . Hypothyroidism 08/29/2015  . GERD (gastroesophageal reflux disease) 08/29/2015  . Irritable bowel syndrome with constipation 08/12/2015  . Acute gross stress reaction 08/12/2015  . Cough 08/09/2015  . SOB (shortness of breath) 08/09/2015  . ETD (eustachian tube dysfunction) 08/09/2015     Past Medical history, Surgical history, Family history, Social history, Allergies and Medications have been entered into the medical record, reviewed and changed as needed.   Allergies:  No Known Allergies  Review of Systems: No fever/ chills, night sweats, no unintended weight loss, No chest pain, or increased shortness of breath. No N/V/D.  Pertinent positives and negatives noted in HPI above    Objective:   There were no vitals filed for this visit. There is no height or weight on file to calculate BMI.  General: Well Developed, well nourished, appropriate for stated age.  Neuro: Alert and oriented x3, extra-ocular muscles intact, sensation grossly intact.  HEENT: Normocephalic, atraumatic, neck supple   Skin: Warm and dry, no gross rash. Cardiac: RRR, S1 S2,  no murmurs rubs or gallops.  Respiratory: ECTA B/L, Not using accessory muscles, speaking in full sentences-unlabored. Vascular:  No gross lower ext edema, cap RF less 2 sec. Psych: No SI/HI, Insight and judgement good

## 2015-11-09 ENCOUNTER — Other Ambulatory Visit: Payer: Self-pay

## 2015-11-18 ENCOUNTER — Ambulatory Visit (INDEPENDENT_AMBULATORY_CARE_PROVIDER_SITE_OTHER): Payer: 59 | Admitting: Family Medicine

## 2015-11-18 ENCOUNTER — Encounter: Payer: Self-pay | Admitting: Family Medicine

## 2015-11-18 VITALS — BP 94/64 | HR 79 | Ht 74.5 in | Wt 199.2 lb

## 2015-11-18 DIAGNOSIS — E559 Vitamin D deficiency, unspecified: Secondary | ICD-10-CM

## 2015-11-18 DIAGNOSIS — R7303 Prediabetes: Secondary | ICD-10-CM | POA: Diagnosis not present

## 2015-11-18 DIAGNOSIS — F430 Acute stress reaction: Secondary | ICD-10-CM | POA: Diagnosis not present

## 2015-11-18 DIAGNOSIS — E785 Hyperlipidemia, unspecified: Secondary | ICD-10-CM

## 2015-11-18 DIAGNOSIS — E079 Disorder of thyroid, unspecified: Secondary | ICD-10-CM

## 2015-11-18 NOTE — Progress Notes (Signed)
Impression and Recommendations:    1. Prediabetes   2. HLD (hyperlipidemia)   3. Hypothyroidism   4. Vitamin D deficiency   5. h/o Acute gross stress reaction     Acute gross stress reaction Stable, no acute complaints.     Feeling more in control / better about himself with recent weight loss of almost 15-20 pounds  Prediabetes ontinue dietary and lifestyle modifications. He is really cut back on carbohydrates and sugar products. We'll follow-up in early November for recheck of A1c.  Hypothyroidism Patient understands dosing  We'll recheck in approximately 6-8 weeks from today.  Importance of consistent dosing understood  - no symptoms, stable  Vitamin D deficiency Continue supplementation   Patient's Medications  New Prescriptions   No medications on file  Previous Medications   ALBUTEROL (PROVENTIL HFA;VENTOLIN HFA) 108 (90 BASE) MCG/ACT INHALER    Inhale 2 puffs into the lungs every 6 (six) hours as needed for wheezing.   ATORVASTATIN (LIPITOR) 20 MG TABLET    Take 1 tablet (20 mg total) by mouth daily.   CHOLECALCIFEROL (VITAMIN D) 1000 UNITS TABLET    Take 5,000 Units by mouth daily.   IBUPROFEN (ADVIL,MOTRIN) 600 MG TABLET    Take 600 mg by mouth every 6 (six) hours as needed for moderate pain.   LEVOTHYROXINE (SYNTHROID) 200 MCG TABLET    Use 134mcg one day, then 226mcg the next and switch doses QOD   LEVOTHYROXINE (SYNTHROID, LEVOTHROID) 175 MCG TABLET    Take 175 mcg by mouth daily before breakfast.   MUPIROCIN OINTMENT (BACTROBAN) 2 %    Apply to affected area TID for 7 days.   OMEGA-3 FATTY ACIDS (FISH OIL) 1000 MG CPDR    Take 4 capsules by mouth daily.   POLYETHYLENE GLYCOL (MIRALAX / GLYCOLAX) PACKET    Take 17 g by mouth daily as needed for mild constipation.  Modified Medications   No medications on file  Discontinued Medications   No medications on file    Return in about 70 days (around 01/27/2016) for come fasting - check lipid. a1c, tsh,  t4, bmp and OV with me.  The patient was counseled, risk factors were discussed, anticipatory guidance given.  Gross side effects, risk and benefits, and alternatives of medications discussed with patient.  Patient is aware that all medications have potential side effects and we are unable to predict every side effect or drug-drug interaction that may occur.  Expresses verbal understanding and consents to current therapy plan and treatment regimen.  Please see AVS handed out to patient at the end of our visit for further patient instructions/ counseling done pertaining to today's office visit.    Note: This document was prepared using Dragon voice recognition software and may include unintentional dictation errors.   --------------------------------------------------------------------------------------------------------------------------------------------------------------------------------------------------------------------------------------------    Subjective:    CC:  Chief Complaint  Patient presents with  . Diabetes    HPI: Andrew Blair is a 50 y.o. male who presents to [place] at [facility] today suppose to be for TSH/ lab reck but he has not been consistent with his dosages the past several  Due reportedly to pharmacy error.   DIET CONTROLLED CHOL:    - TG:  274 last time in June.      -  Diet controlled and takes omega-3 fatty acids only 1000 mg daily.   Has lost weight.    Cut back on saturated and Transfats   Lab  Results  Component Value Date   LDLCALC 174 (H) 08/19/2015    Pre-DM:  -has been working on diet and exercise for Pre-diabetes, and denies symptoms Lab Results  Component Value Date   HGBA1C 5.8 (H) 08/19/2015    Vit D:  Patient is on Vitamin D supplement. Tol well   TSH: Patient is supposed to be on 175 one day and then 200 g the next.   Pt 's pharmacy  reportedlymessed up and gave him only the 175 for c wks, so we will  wait another 6-8 wks before reck.   Patient without symptoms   Lab Results  Component Value Date   TSH 7.92 (H) 08/19/2015       Wt Readings from Last 3 Encounters:  11/18/15 199 lb 3.2 oz (90.4 kg)  09/08/15 210 lb 6.4 oz (95.4 kg)  08/26/15 214 lb 14.4 oz (97.5 kg)   BP Readings from Last 3 Encounters:  11/18/15 94/64  09/08/15 102/68  08/26/15 118/66   Pulse Readings from Last 3 Encounters:  11/18/15 79  09/08/15 64  08/26/15 71     Patient Active Problem List   Diagnosis Date Noted  . Fatigue 09/13/2015  . Paronychia of finger of right hand 09/08/2015  . HLD (hyperlipidemia) 08/29/2015  . Prediabetes 08/29/2015  . Vitamin D deficiency 08/29/2015  . Hypothyroidism 08/29/2015  . GERD (gastroesophageal reflux disease) 08/29/2015  . Irritable bowel syndrome with constipation 08/12/2015  . Acute gross stress reaction 08/12/2015  . Cough 08/09/2015  . SOB (shortness of breath) 08/09/2015  . ETD (eustachian tube dysfunction) 08/09/2015    Past Medical history, Surgical history, Family history, Social history, Allergies and Medications have been entered into the medical record, reviewed and changed as needed.   Allergies:  No Known Allergies  Review of Systems: No fever/ chills, night sweats, no unintended weight loss, No chest pain, or increased shortness of breath. No N/V/D.  Pertinent positives and negatives noted in HPI above    Objective:   Blood pressure 94/64, pulse 79, height 6' 2.5" (1.892 m), weight 199 lb 3.2 oz (90.4 kg). Body mass index is 25.23 kg/m. General: Well Developed, well nourished, appropriate for stated age.  Neuro: Alert and oriented x3, extra-ocular muscles intact, sensation grossly intact.  HEENT: Normocephalic, atraumatic, neck supple   Skin: Warm and dry, no gross rash. Cardiac: RRR, S1 S2,  no murmurs rubs or gallops.  Respiratory: ECTA B/L, Not using accessory muscles, speaking in full sentences-unlabored. Vascular:  No gross  lower ext edema, cap RF less 2 sec. Psych: No HI/SI, judgement and insight good, Euthymic mood. Full Affect.

## 2015-11-18 NOTE — Patient Instructions (Addendum)
Keep up the good work!!  Told pt to take 4 gram fish oil per day.

## 2015-11-18 NOTE — Assessment & Plan Note (Signed)
Stable, no acute complaints.     Feeling more in control / better about himself with recent weight loss of almost 15-20 pounds

## 2015-11-18 NOTE — Assessment & Plan Note (Signed)
Continue supplementation  ?

## 2015-11-18 NOTE — Assessment & Plan Note (Signed)
Patient understands dosing  We'll recheck in approximately 6-8 weeks from today.  Importance of consistent dosing understood  - no symptoms, stable

## 2015-11-18 NOTE — Assessment & Plan Note (Signed)
ontinue dietary and lifestyle modifications. He is really cut back on carbohydrates and sugar products. We'll follow-up in early November for recheck of A1c.

## 2016-01-13 ENCOUNTER — Encounter: Payer: Self-pay | Admitting: Family Medicine

## 2016-01-13 NOTE — Progress Notes (Signed)
This encounter was created in error - please disregard.

## 2016-01-27 ENCOUNTER — Ambulatory Visit: Payer: 59 | Admitting: Family Medicine

## 2016-02-15 ENCOUNTER — Ambulatory Visit: Payer: 59 | Admitting: Family Medicine

## 2016-03-02 ENCOUNTER — Encounter: Payer: Self-pay | Admitting: Family Medicine

## 2016-03-02 ENCOUNTER — Telehealth: Payer: Self-pay

## 2016-03-02 ENCOUNTER — Ambulatory Visit (INDEPENDENT_AMBULATORY_CARE_PROVIDER_SITE_OTHER): Payer: 59 | Admitting: Family Medicine

## 2016-03-02 VITALS — BP 113/77 | HR 100 | Ht 74.5 in | Wt 212.6 lb

## 2016-03-02 DIAGNOSIS — M899 Disorder of bone, unspecified: Secondary | ICD-10-CM

## 2016-03-02 DIAGNOSIS — R079 Chest pain, unspecified: Secondary | ICD-10-CM | POA: Diagnosis not present

## 2016-03-02 MED ORDER — HYDROCODONE-ACETAMINOPHEN 10-325 MG/15ML PO SOLN: 1.5000 mg/kg | Freq: Four times a day (QID) | ORAL | 0 refills | Status: DC | PRN

## 2016-03-02 NOTE — Progress Notes (Signed)
Impression and Recommendations:    1. Lump of rib T6-7   2. Chest pain, unspecified type    - pain control - activity modification - x-rays stat today--> will go to GI- on [address].   Orders Placed This Encounter  Procedures  . DG Ribs Unilateral W/Chest Left     New Prescriptions   HYDROCODONE-ACETAMINOPHEN 10-325 MG/15ML SOLN    Take 1.5 mg/kg of hydrocodone by mouth every 6 (six) hours as needed.    Modified Medications   No medications on file    Discontinued Medications   No medications on file    The patient was counseled, risk factors were discussed, anticipatory guidance given.  Gross side effects, risk and benefits, and alternatives of medications and treatment plan in general discussed with patient.  Patient is aware that all medications have potential side effects and we are unable to predict every side effect or drug-drug interaction that may occur.   Patient will call with any questions prior to using medication if they have concerns.  Expresses verbal understanding and consents to current therapy and treatment regimen.  No barriers to understanding were identified.  Red flag symptoms and signs discussed in detail.  Patient expressed understanding regarding what to do in case of emergency\urgent symptoms  Return if symptoms worsen or fail to improve.  Please see AVS handed out to patient at the end of our visit for further patient instructions/ counseling done pertaining to today's office visit.    Note: This document was prepared using Dragon voice recognition software and may include unintentional dictation errors.   --------------------------------------------------------------------------------------------------------------------------------------------------------------------------------------------------------------------------------------------    Subjective:    CC:  Chief Complaint  Patient presents with  . Chest Pain    HPI: Andrew Blair is a 50 y.o. male who presents to [place] at [facility] today for issues as discussed below.   Wrestling with the dog-->  70lb dog- wreslting it to the ground and felt pop in ribs- same area as prior.  (hx 2 broken ribs on r and 1 on L) Most tender L T6 region - anteriorly. Severe pain- "kills to sneeze".   Hurts really bad to take deep breaths and coughs   Wt Readings from Last 3 Encounters:  03/02/16 212 lb 9.6 oz (96.4 kg)  11/18/15 199 lb 3.2 oz (90.4 kg)  09/08/15 210 lb 6.4 oz (95.4 kg)   BP Readings from Last 3 Encounters:  03/02/16 113/77  11/18/15 94/64  09/08/15 102/68   Pulse Readings from Last 3 Encounters:  03/02/16 100  11/18/15 79  09/08/15 64   BMI Readings from Last 3 Encounters:  03/02/16 26.93 kg/m  11/18/15 25.23 kg/m  09/08/15 26.65 kg/m     Patient Care Team    Relationship Specialty Notifications Start End  Mellody Dance, DO PCP - General Family Medicine  08/09/15     Patient Active Problem List   Diagnosis Date Noted  . Fatigue 09/13/2015  . Paronychia of finger of right hand 09/08/2015  . HLD (hyperlipidemia) 08/29/2015  . Prediabetes 08/29/2015  . Vitamin D deficiency 08/29/2015  . Hypothyroidism 08/29/2015  . GERD (gastroesophageal reflux disease) 08/29/2015  . Irritable bowel syndrome with constipation 08/12/2015  . Acute gross stress reaction 08/12/2015  . Cough 08/09/2015  . SOB (shortness of breath) 08/09/2015  . ETD (eustachian tube dysfunction) 08/09/2015    Past Medical history, Surgical history, Family history, Social history, Allergies and Medications have been entered into the  medical record, reviewed and changed as needed.   Allergies:  No Known Allergies  Review of Systems  Constitutional: Negative for chills and fever.  Respiratory: Negative for cough, hemoptysis, shortness of breath and wheezing.   Cardiovascular: Negative for chest pain and palpitations.  Gastrointestinal: Negative for  abdominal pain, nausea and vomiting.  Genitourinary: Negative for flank pain.  Musculoskeletal:       Rib pain, pt states he fell on 02/29/16 while playing with his dogs.  Hit his ribs on the floor.  Pt states that he heard a "crack".  He states that he has a history of broken ribs.  Neurological: Negative for dizziness, focal weakness and headaches.  Endo/Heme/Allergies: Does not bruise/bleed easily.     Objective:   Blood pressure 113/77, pulse 100, height 6' 2.5" (1.892 m), weight 212 lb 9.6 oz (96.4 kg). Body mass index is 26.93 kg/m. General: Well Developed, well nourished, appropriate for stated age.  Neuro: Alert and oriented x3, extra-ocular muscles intact, sensation grossly intact.  HEENT: Normocephalic, atraumatic, neck supple, no carotid bruits appreciated  Skin: no gross rash. Cardiac: RRR, S1 S2 Respiratory: ECTA B/L, Not using accessory muscles, speaking in full sentences-unlabored.   Very [place] to Palp anterior sternoclavicular jt and just lateral- L T6 region with bulge felt Vascular:  Ext warm, dry, pink; cap RF less 2 sec. Psych: No HI/SI, judgement and insight good, Euthymic mood. Full Affect.

## 2016-03-02 NOTE — Telephone Encounter (Signed)
Andrew Blair, hair feel bad that he was only dispensed 10 pills yet still had to pay his co-pay for that. His or any way we can maybe call the pharmacy and tell them to dispense 20 or 30 to the patient?

## 2016-03-02 NOTE — Telephone Encounter (Signed)
Pharmacy called stating that the liquid hydrocodone does not come in 10/325 dose.  Advised pharmacy they may dispense tablets instead with a quantity of #10.  Charyl Bigger, CMA

## 2016-03-02 NOTE — Telephone Encounter (Signed)
Spoke with Colletta Maryland at [facility].  RX changed to #20 for the hydrocodone.  Charyl Bigger, CMA

## 2016-03-02 NOTE — Patient Instructions (Addendum)
Activity modification is important as fractured ribs can move and cause a pneumothorax.  Please be cautious- if acute onset SOB/ diff to breath- to UC/ ED  Rib Fracture A rib fracture is a break or crack in one of the bones of the ribs. The ribs are a group of long, curved bones that wrap around your chest and attach to your spine. They protect your lungs and other organs in the chest cavity. A broken or cracked rib is often painful, but most do not cause other problems. Most rib fractures heal on their own over time. However, rib fractures can be more serious if multiple ribs are broken or if broken ribs move out of place and push against other structures. What are the causes?  A direct blow to the chest. For example, this could happen during contact sports, a car accident, or a fall against a hard object.  Repetitive movements with high force, such as pitching a baseball or having severe coughing spells. What are the signs or symptoms?  Pain when you breathe in or cough.  Pain when someone presses on the injured area. How is this diagnosed? Your caregiver will perform a physical exam. Various imaging tests may be ordered to confirm the diagnosis and to look for related injuries. These tests may include a chest X-ray, computed tomography (CT), magnetic resonance imaging (MRI), or a bone scan. How is this treated? Rib fractures usually heal on their own in 1-3 months. The longer healing period is often associated with a continued cough or other aggravating activities. During the healing period, pain control is very important. Medication is usually given to control pain. Hospitalization or surgery may be needed for more severe injuries, such as those in which multiple ribs are broken or the ribs have moved out of place. Follow these instructions at home:  Avoid strenuous activity and any activities or movements that cause pain. Be careful during activities and avoid bumping the injured  rib.  Gradually increase activity as directed by your caregiver.  Only take over-the-counter or prescription medications as directed by your caregiver. Do not take other medications without asking your caregiver first.  Apply ice to the injured area for the first 1-2 days after you have been treated or as directed by your caregiver. Applying ice helps to reduce inflammation and pain.  Put ice in a plastic bag.  Place a towel between your skin and the bag.  Leave the ice on for 15-20 minutes at a time, every 2 hours while you are awake.  Perform deep breathing as directed by your caregiver. This will help prevent pneumonia, which is a common complication of a broken rib. Your caregiver may instruct you to:  Take deep breaths several times a day.  Try to cough several times a day, holding a pillow against the injured area.  Use a device called an incentive spirometer to practice deep breathing several times a day.  Drink enough fluids to keep your urine clear or pale yellow. This will help you avoid constipation.  Do not wear a rib belt or binder. These restrict breathing, which can lead to pneumonia. Get help right away if:  You have a fever.  You have difficulty breathing or shortness of breath.  You develop a continual cough, or you cough up thick or bloody sputum.  You feel sick to your stomach (nausea), throw up (vomit), or have abdominal pain.  You have worsening pain not controlled with medications. This information is not intended  to replace advice given to you by your health care provider. Make sure you discuss any questions you have with your health care provider. Document Released: 03/05/2005 Document Revised: 08/11/2015 Document Reviewed: 05/07/2012 Elsevier Interactive Patient Education  2017 [employer].

## 2016-03-05 ENCOUNTER — Ambulatory Visit
Admission: RE | Admit: 2016-03-05 | Discharge: 2016-03-05 | Disposition: A | Discharge status: Home / Self Care | Payer: 59 | Source: Ambulatory Visit | Attending: Family Medicine | Admitting: Family Medicine

## 2016-03-05 ENCOUNTER — Telehealth: Payer: Self-pay

## 2016-03-05 DIAGNOSIS — R9389 Abnormal findings on diagnostic imaging of other specified body structures: Secondary | ICD-10-CM

## 2016-03-05 NOTE — Telephone Encounter (Signed)
-----   Message from Mellody Dance, DO sent at 03/05/2016  3:18 PM EST ----- Chest CT to better visualize the Left chest and any pathology that may be there.

## 2016-03-05 NOTE — Telephone Encounter (Signed)
Spoke with pt and advised that he needs CT to better visualize the chest areas.  Pt expressed understanding and is agreeable.  Orders placed per Dr. Raliegh Scarlet.  Charyl Bigger, CMA

## 2016-03-08 ENCOUNTER — Encounter: Payer: Self-pay | Admitting: Family Medicine

## 2016-03-08 ENCOUNTER — Ambulatory Visit (INDEPENDENT_AMBULATORY_CARE_PROVIDER_SITE_OTHER): Payer: 59 | Admitting: Family Medicine

## 2016-03-08 VITALS — BP 116/76 | HR 73 | Temp 97.9°F | Ht 74.5 in | Wt 212.6 lb

## 2016-03-08 DIAGNOSIS — R05 Cough: Secondary | ICD-10-CM | POA: Diagnosis not present

## 2016-03-08 DIAGNOSIS — S2242XD Multiple fractures of ribs, left side, subsequent encounter for fracture with routine healing: Secondary | ICD-10-CM | POA: Diagnosis not present

## 2016-03-08 DIAGNOSIS — R0602 Shortness of breath: Secondary | ICD-10-CM | POA: Diagnosis not present

## 2016-03-08 DIAGNOSIS — R058 Other specified cough: Secondary | ICD-10-CM

## 2016-03-08 DIAGNOSIS — R42 Dizziness and giddiness: Secondary | ICD-10-CM | POA: Diagnosis not present

## 2016-03-08 MED ORDER — HYDROCODONE-ACETAMINOPHEN 10-325 MG PO TABS: 1.0000 | ORAL_TABLET | Freq: Three times a day (TID) | ORAL | 0 refills | Status: DC | PRN

## 2016-03-08 MED ORDER — DOXYCYCLINE HYCLATE 100 MG PO TABS: 100.0000 mg | ORAL_TABLET | Freq: Two times a day (BID) | ORAL | 0 refills | Status: DC

## 2016-03-08 MED ORDER — MOXIFLOXACIN HCL 400 MG PO TABS
400.0000 mg | ORAL_TABLET | Freq: Every day | ORAL | 0 refills | Status: DC
Start: 2016-03-08 — End: 2016-03-23

## 2016-03-08 NOTE — Progress Notes (Signed)
Impression and Recommendations:    1. SOB (shortness of breath)   2. Dizzy   3. Lightheaded   4. Productive cough   5. Closed fracture of multiple ribs of left side with routine healing, subsequent encounter     mucinex dm OTC  Lots water  Supportive care and meds d/c pt  Pt going for CT scan tomorrow  - Patient finished Avelox, will cover with additional antibiotics today.  - Pain medications given-importance of deep breathing and expanding his lungs are regular basis stressed to patient.  - Told patient I will call him after CT results come through.  - Orders per below  Orders Placed This Encounter  Procedures  . CBC with Differential/Platelet  . COMPLETE METABOLIC PANEL WITH GFR  . Magnesium  . Phosphorus  . T4, free  . TSH  . VITAMIN D 25 Hydroxy (Vit-D Deficiency, Fractures)     New Prescriptions   AMITRIPTYLINE (ELAVIL) 25 MG TABLET    Take 1-2 tablets (25-50 mg total) by mouth at bedtime.   DOXYCYCLINE (VIBRA-TABS) 100 MG TABLET    Take 1 tablet (100 mg total) by mouth 2 (two) times daily.   HYDROCODONE-ACETAMINOPHEN (NORCO) 10-325 MG TABLET    Take 1 tablet by mouth every 8 (eight) hours as needed. Rib pain with severe coughing    Modified Medications   Modified Medication Previous Medication   LEVOTHYROXINE (SYNTHROID, LEVOTHROID) 175 MCG TABLET levothyroxine (SYNTHROID, LEVOTHROID) 175 MCG tablet      Take 1 tablet (175 mcg total) by mouth daily before breakfast.    Take 175 mcg by mouth daily before breakfast.    Discontinued Medications   HYDROCODONE-ACETAMINOPHEN 10-325 MG/15ML SOLN    Take 1.5 mg/kg of hydrocodone by mouth every 6 (six) hours as needed.   MOXIFLOXACIN (AVELOX) 400 MG TABLET    Take 1 tablet (400 mg total) by mouth daily.   MUPIROCIN OINTMENT (BACTROBAN) 2 %    Apply to affected area TID for 7 days.    The patient was counseled, risk factors were discussed, anticipatory guidance given.  Gross side effects, risk and  benefits, and alternatives of medications and treatment plan in general discussed with patient.  Patient is aware that all medications have potential side effects and we are unable to predict every side effect or drug-drug interaction that may occur.   Patient will call with any questions prior to using medication if they have concerns.  Expresses verbal understanding and consents to current therapy and treatment regimen.  No barriers to understanding were identified.  Red flag symptoms and signs discussed in detail.  Patient expressed understanding regarding what to do in case of emergency\urgent symptoms  Return if symptoms worsen or fail to improve.  Please see AVS handed out to patient at the end of our visit for further patient instructions/ counseling done pertaining to today's office visit.    Note: This document was prepared using Dragon voice recognition software and may include unintentional dictation errors.   --------------------------------------------------------------------------------------------------------------------------------------------------------------------------------------------------------------------------------------------    Subjective:    CC:  Chief Complaint  Patient presents with  . Cough    HPI: Andrew Blair is a 50 y.o. male who presents to [place] at [facility] today for issues as discussed below.   Patient seen on 12\15\17 for rib injury-possible rib fracture.  Please see this office visit for details.  Over this past weekend--> patient experienced severe fatigue, dizziness, hands feel numb- B/L, SOB and  DIB; Then felt a little better- but mOnday- started with productive cough--> lots mucus.  No blood.  No F/C.  No one-sided face pain/ chest pain.  No N/V/D.  Tactile sensation gives him extremely chilly skin.  Pt worried about his "broken ribs" and worried he may be getting PNA.     Also r/w pt recent CXR results and need for  further f/up- CT scan tomorrow.    Wt Readings from Last 3 Encounters:  03/23/16 211 lb 11.2 oz (96 kg)  03/08/16 212 lb 9.6 oz (96.4 kg)  03/02/16 212 lb 9.6 oz (96.4 kg)   BP Readings from Last 3 Encounters:  03/23/16 109/77  03/08/16 116/76  03/02/16 113/77   Pulse Readings from Last 3 Encounters:  03/23/16 73  03/08/16 73  03/02/16 100   BMI Readings from Last 3 Encounters:  03/23/16 26.82 kg/m  03/08/16 26.93 kg/m  03/02/16 26.93 kg/m     Patient Care Team    Relationship Specialty Notifications Start End  Mellody Dance, DO PCP - General Family Medicine  08/09/15     Patient Active Problem List   Diagnosis Date Noted  . Hypertriglyceridemia 10/08/2015    Priority: High  . HLD (hyperlipidemia) 08/29/2015    Priority: High  . Prediabetes 08/29/2015    Priority: High  . Hypothyroidism 08/29/2015    Priority: High  . GERD (gastroesophageal reflux disease) 08/29/2015    Priority: Medium  . Irritable bowel syndrome with constipation 08/12/2015    Priority: Medium  . Acute gross stress reaction / anxiety 08/12/2015    Priority: Medium  . Fatigue 09/13/2015    Priority: Low  . Vitamin D deficiency 08/29/2015    Priority: Low  . ETD (eustachian tube dysfunction) 08/09/2015    Priority: Low  . Paronychia of finger of right hand 09/08/2015  . Cough 08/09/2015  . SOB (shortness of breath) 08/09/2015  . Environmental and seasonal allergies 02/20/2011    Past Medical history, Surgical history, Family history, Social history, Allergies and Medications have been entered into the medical record, reviewed and changed as needed.   Allergies:  No Known Allergies  Review of Systems  Constitutional: Positive for chills and malaise/fatigue. Negative for fever.  HENT: Positive for congestion and sore throat. Negative for ear discharge, ear pain and sinus pain.   Eyes: Negative for pain and discharge.  Respiratory: Positive for cough, sputum production, shortness  of breath and wheezing. Negative for hemoptysis.   Cardiovascular: Positive for chest pain. Negative for palpitations.  Gastrointestinal: Positive for nausea. Negative for diarrhea and vomiting.  Musculoskeletal: Positive for back pain. Negative for joint pain, myalgias and neck pain.  Skin: Negative for rash.  Neurological: Positive for dizziness and tingling. Negative for focal weakness and headaches.  Endo/Heme/Allergies: Negative for environmental allergies.  Psychiatric/Behavioral: The patient does not have insomnia.      Objective:   Blood pressure 116/76, pulse 73, temperature 97.9 F (36.6 C), temperature source Oral, height 6' 2.5" (1.892 m), weight 212 lb 9.6 oz (96.4 kg), SpO2 97 %. Body mass index is 26.93 kg/m. General: Well Developed, well nourished, appropriate for stated age.  Neuro: Alert and oriented x3, extra-ocular muscles intact, sensation and strength intact, equal bilaterally in upper and lower extremities.Marland Kitchen  HEENT: Normocephalic, atraumatic, neck supple, TMs clear bilaterally-within normal limits, oropharynx clear nares-clear bilaterally.  No cervical lymphadenopathy. Skin: no gross rash. Cardiac: RRR, S1 S2 Respiratory: ECTA B/L- no crackles wheezes or rales, Not using accessory muscles, speaking in full  sentences-unlabored. Vascular:  Ext warm, dry, pink; cap RF less 2 sec. Psych: No HI/SI, judgement and insight good, Euthymic mood. Full Affect.

## 2016-03-08 NOTE — Patient Instructions (Addendum)
mucinex dm  Lots water  Supportive care  CT scan tomorrow

## 2016-03-09 ENCOUNTER — Ambulatory Visit
Admission: RE | Admit: 2016-03-09 | Discharge: 2016-03-09 | Disposition: A | Discharge status: Home / Self Care | Payer: 59 | Source: Ambulatory Visit | Attending: Family Medicine | Admitting: Family Medicine

## 2016-03-09 DIAGNOSIS — F1910 Other psychoactive substance abuse, uncomplicated: Secondary | ICD-10-CM | POA: Insufficient documentation

## 2016-03-09 LAB — COMPLETE METABOLIC PANEL WITH GFR
ALT: 39 U/L (ref 9–46)
AST: 34 U/L (ref 10–40)
Albumin: 4.4 g/dL (ref 3.6–5.1)
Alkaline Phosphatase: 75 U/L (ref 40–115)
BUN: 16 mg/dL (ref 7–25)
CHLORIDE: 101 mmol/L (ref 98–110)
CO2: 27 mmol/L (ref 20–31)
CREATININE: 1.04 mg/dL (ref 0.60–1.35)
Calcium: 9.1 mg/dL (ref 8.6–10.3)
GFR, Est Non African American: 84 mL/min (ref >=60)
Glucose, Bld: 82 mg/dL (ref 65–99)
Potassium: 4.2 mmol/L (ref 3.5–5.3)
SODIUM: 138 mmol/L (ref 135–146)
TOTAL PROTEIN: 7.3 g/dL (ref 6.1–8.1)
Total Bilirubin: 0.6 mg/dL (ref 0.2–1.2)

## 2016-03-09 LAB — CBC WITH DIFFERENTIAL/PLATELET
BASOS PCT: 0 %
Basophils Absolute: 0 cells/uL (ref 0–200)
EOS PCT: 2 %
Eosinophils Absolute: 100 cells/uL (ref 15–500)
HCT: 41.8 % (ref 38.5–50.0)
Hemoglobin: 14.2 g/dL (ref 13.2–17.1)
Lymphocytes Relative: 24 %
Lymphs Abs: 1200 cells/uL (ref 850–3900)
MCH: 31.1 pg (ref 27.0–33.0)
MCHC: 34 g/dL (ref 32.0–36.0)
MCV: 91.7 fL (ref 80.0–100.0)
MONOS PCT: 7 %
MPV: 10 fL (ref 7.5–12.5)
Monocytes Absolute: 350 cells/uL (ref 200–950)
NEUTROS ABS: 3350 {cells}/uL (ref 1500–7800)
Neutrophils Relative %: 67 %
PLATELETS: 179 10*3/uL (ref 140–400)
RBC: 4.56 MIL/uL (ref 4.20–5.80)
RDW: 14 % (ref 11.0–15.0)
WBC: 5 10*3/uL (ref 3.8–10.8)

## 2016-03-09 LAB — TSH: TSH: 4 mIU/L (ref 0.40–4.50)

## 2016-03-09 LAB — VITAMIN D 25 HYDROXY (VIT D DEFICIENCY, FRACTURES): VIT D 25 HYDROXY: 56 ng/mL (ref 30–100)

## 2016-03-09 LAB — MAGNESIUM: Magnesium: 2 mg/dL (ref 1.5–2.5)

## 2016-03-09 LAB — T4, FREE: FREE T4: 1.4 ng/dL (ref 0.8–1.8)

## 2016-03-09 LAB — PHOSPHORUS: Phosphorus: 3.7 mg/dL (ref 2.5–4.5)

## 2016-03-09 MED ORDER — IOPAMIDOL (ISOVUE-300) INJECTION 61%
75.0000 mL | Freq: Once | INTRAVENOUS | Status: AC | PRN
  Administered 2016-03-09: 75 mL via INTRAVENOUS

## 2016-03-23 ENCOUNTER — Ambulatory Visit (INDEPENDENT_AMBULATORY_CARE_PROVIDER_SITE_OTHER): Payer: 59 | Admitting: Family Medicine

## 2016-03-23 ENCOUNTER — Encounter: Payer: Self-pay | Admitting: Family Medicine

## 2016-03-23 VITALS — BP 109/77 | HR 73 | Ht 74.5 in | Wt 211.7 lb

## 2016-03-23 DIAGNOSIS — G479 Sleep disorder, unspecified: Secondary | ICD-10-CM

## 2016-03-23 DIAGNOSIS — J189 Pneumonia, unspecified organism: Secondary | ICD-10-CM | POA: Diagnosis not present

## 2016-03-23 DIAGNOSIS — R079 Chest pain, unspecified: Secondary | ICD-10-CM

## 2016-03-23 DIAGNOSIS — R058 Other specified cough: Secondary | ICD-10-CM

## 2016-03-23 DIAGNOSIS — R0602 Shortness of breath: Secondary | ICD-10-CM

## 2016-03-23 DIAGNOSIS — F430 Acute stress reaction: Secondary | ICD-10-CM

## 2016-03-23 DIAGNOSIS — R05 Cough: Secondary | ICD-10-CM | POA: Diagnosis not present

## 2016-03-23 MED ORDER — AMITRIPTYLINE HCL 25 MG PO TABS: 25.0000 mg | ORAL_TABLET | Freq: Every day | ORAL | 1 refills | Status: DC

## 2016-03-23 MED ORDER — LEVOTHYROXINE SODIUM 175 MCG PO TABS: 175.0000 ug | ORAL_TABLET | Freq: Every day | ORAL | 0 refills | Status: DC

## 2016-03-23 NOTE — Progress Notes (Signed)
Acute Care Office visit  Assessment and plan:  1. Pneumonia of left lung due to infectious organism, unspecified part of lung   2. SOB (shortness of breath)   3. Productive cough   4. Chest pain, unspecified type   5. Sleep difficulties   6. Acute gross stress reaction / anxiety    - cont doses of doxy til gone.   - reminded pt that could take up to 6-8wks to improve completely. - some GI distress due to ABX use--> probiotics and yogurt.   Avoid fatty/ aggravating foods - for your diff sleeping at times--> take elavil if melatonin up to 10mg  no help - referral for pulm--> they will determine further eval ---CXR vs CTscan for pt's 4-5 wk f/up--> around Jan 22-30.  Pt prefers this and has excess worry about condition today.     Acute gross stress reaction / anxiety In the past patient admitted to feeling a lot of different physical symptoms when he is under stress.   Admits to having poor coping skills at times and can have a short fuse with his wife and children in certain stressful situations.   Can feel depressed at times, and other times anxious but bounces back quickly.  He has never been on anything for this in the past.  Declines medicines today.   Orders Placed This Encounter  Procedures  . Ambulatory referral to Pulmonology     New Prescriptions   AMITRIPTYLINE (ELAVIL) 25 MG TABLET    Take 1-2 tablets (25-50 mg total) by mouth at bedtime.    Modified Medications   Modified Medication Previous Medication   LEVOTHYROXINE (SYNTHROID, LEVOTHROID) 175 MCG TABLET levothyroxine (SYNTHROID, LEVOTHROID) 175 MCG tablet      Take 1 tablet (175 mcg total) by mouth daily before breakfast.    Take 175 mcg by mouth daily before breakfast.    Discontinued Medications   MOXIFLOXACIN (AVELOX) 400 MG TABLET    Take 1 tablet (400 mg total) by mouth daily.     Gross side effects, risk and benefits, and alternatives of medications discussed with patient.  Patient is aware that  all medications have potential side effects and we are unable to predict every sideeffect or drug-drug interaction that may occur.  Expresses verbal understanding and consents to current therapy plan and treatment regiment.  Return if symptoms worsen or fail to improve.  Please see AVS handed out to patient at the end of our visit for additional patient instructions/ counseling done pertaining to today's office visit.  Note: This document was prepared using Dragon voice recognition software and may include unintentional dictation errors.    Subjective:    Chief Complaint  Patient presents with  . Cough    follow up  . Chest Pain    follow up    HPI:  Follow-up left rib injury and pneumonia  CT results from 03/09/16 r/w pt in detail.  All Q's answered.   Slowly getting better.    Not improving as well as he would llike.   Still with some PND/ head congestion.   Pain from L ant chest---> straight thru to his back.  Overall getting better---> very slowly; pt frustrated and concerned something serious may be wrong. .   Patient Active Problem List   Diagnosis Date Noted  . Hypertriglyceridemia 10/08/2015    Priority: High  . HLD (hyperlipidemia) 08/29/2015    Priority: High  . Prediabetes 08/29/2015    Priority: High  . Hypothyroidism  08/29/2015    Priority: High  . GERD (gastroesophageal reflux disease) 08/29/2015    Priority: Medium  . Irritable bowel syndrome with constipation 08/12/2015    Priority: Medium  . Acute gross stress reaction / anxiety 08/12/2015    Priority: Medium  . Fatigue 09/13/2015    Priority: Low  . Vitamin D deficiency 08/29/2015    Priority: Low  . ETD (eustachian tube dysfunction) 08/09/2015    Priority: Low  . Paronychia of finger of right hand 09/08/2015  . Cough 08/09/2015  . SOB (shortness of breath) 08/09/2015  . Environmental and seasonal allergies 02/20/2011    Past medical history, Surgical history, Family history reviewed and noted  below, Social history, Allergies, and Medications have been entered into the medical record, reviewed and changed as needed.   No Known Allergies  Review of Systems  Constitutional: Positive for malaise/fatigue. Negative for chills and fever.  HENT: Positive for congestion and sore throat. Negative for ear discharge, ear pain and sinus pain.   Eyes: Negative for pain and discharge.  Respiratory: Positive for cough, sputum production and shortness of breath. Negative for wheezing.   Cardiovascular: Positive for chest pain.  Gastrointestinal: Positive for diarrhea. Negative for nausea and vomiting.  Musculoskeletal: Negative for neck pain.  Skin: Negative for rash.  Neurological: Negative for dizziness and headaches.  Endo/Heme/Allergies: Negative for environmental allergies.  Psychiatric/Behavioral: The patient has insomnia.     Objective:   Blood pressure 109/77, pulse 73, height 6' 2.5" (1.892 m), weight 211 lb 11.2 oz (96 kg). Body mass index is 26.82 kg/m. General: Well Developed, well nourished, appropriate for stated age.  Neuro: Alert and oriented x3, extra-ocular muscles intact, sensation grossly intact.  HEENT: Normocephalic, atraumatic, pupils equal round reactive to light, neck supple, no masses, no painful lymphadenopathy, TM's intact B/L, no acute findings. Nares- patent, clear d/c, OP- clear, mild erythema, No TTP sinuses Skin: Warm and dry, no gross rash. Cardiac: RRR, S1 S2,  no murmurs rubs or gallops.  Respiratory: ECTA B/L and A/P- good aeration anteriorly and posteriorly., Not using accessory muscles, speaking in full sentences- unlabored. Vascular:  No gross lower ext edema, cap RF less 2 sec. Psych: No HI/SI, judgement and insight good, Euthymic mood. Full Affect.   Patient Care Team    Relationship Specialty Notifications Start End  Mellody Dance, DO PCP - General Family Medicine  08/09/15

## 2016-03-23 NOTE — Patient Instructions (Signed)
Community-Acquired Pneumonia, Adult °Introduction °Pneumonia is an infection of the lungs. One type of pneumonia can happen while a person is in a hospital. A different type can happen when a person is not in a hospital (community-acquired pneumonia). It is easy for this kind to spread from person to person. It can spread to you if you breathe near an infected person who coughs or sneezes. Some symptoms include: °· A dry cough. °· A wet (productive) cough. °· Fever. °· Sweating. °· Chest pain. °Follow these instructions at home: °· Take over-the-counter and prescription medicines only as told by your doctor. °¨ Only take cough medicine if you are losing sleep. °¨ If you were prescribed an antibiotic medicine, take it as told by your doctor. Do not stop taking the antibiotic even if you start to feel better. °· Sleep with your head and neck raised (elevated). You can do this by putting a few pillows under your head, or you can sleep in a recliner. °· Do not use tobacco products. These include cigarettes, chewing tobacco, and e-cigarettes. If you need help quitting, ask your doctor. °· Drink enough water to keep your pee (urine) clear or pale yellow. °A shot (vaccine) can help prevent pneumonia. Shots are often suggested for: °· People older than 50 years of age. °· People older than 50 years of age: °¨ Who are having cancer treatment. °¨ Who have long-term (chronic) lung disease. °¨ Who have problems with their body's defense system (immune system). °You may also prevent pneumonia if you take these actions: °· Get the flu (influenza) shot every year. °· Go to the dentist as often as told. °· Wash your hands often. If soap and water are not available, use hand sanitizer. °Contact a doctor if: °· You have a fever. °· You lose sleep because your cough medicine does not help. °Get help right away if: °· You are short of breath and it gets worse. °· You have more chest pain. °· Your sickness gets worse. This is very  serious if: °¨ You are an older adult. °¨ Your body's defense system is weak. °· You cough up blood. °This information is not intended to replace advice given to you by your health care provider. Make sure you discuss any questions you have with your health care provider. °Document Released: 08/22/2007 Document Revised: 08/11/2015 Document Reviewed: 06/30/2014 °© 2017 Elsevier ° °

## 2016-04-08 NOTE — Assessment & Plan Note (Signed)
In the past patient admitted to feeling a lot of different physical symptoms when he is under stress.   Admits to having poor coping skills at times and can have a short fuse with his wife and children in certain stressful situations.   Can feel depressed at times, and other times anxious but bounces back quickly.  He has never been on anything for this in the past.  Declines medicines today.

## 2016-04-09 ENCOUNTER — Telehealth: Payer: Self-pay | Admitting: Family Medicine

## 2016-04-10 NOTE — Telephone Encounter (Signed)
error 

## 2016-04-16 ENCOUNTER — Encounter: Payer: Self-pay | Admitting: Pulmonary Disease

## 2016-04-16 ENCOUNTER — Ambulatory Visit (INDEPENDENT_AMBULATORY_CARE_PROVIDER_SITE_OTHER): Payer: BLUE CROSS/BLUE SHIELD | Admitting: Pulmonary Disease

## 2016-04-16 ENCOUNTER — Ambulatory Visit (INDEPENDENT_AMBULATORY_CARE_PROVIDER_SITE_OTHER)
Admission: RE | Admit: 2016-04-16 | Discharge: 2016-04-16 | Disposition: A | Discharge status: Home / Self Care | Payer: BLUE CROSS/BLUE SHIELD | Source: Ambulatory Visit | Attending: Pulmonary Disease | Admitting: Pulmonary Disease

## 2016-04-16 VITALS — BP 118/68 | HR 74 | Ht 74.5 in | Wt 214.0 lb

## 2016-04-16 DIAGNOSIS — R938 Abnormal findings on diagnostic imaging of other specified body structures: Secondary | ICD-10-CM

## 2016-04-16 DIAGNOSIS — R9389 Abnormal findings on diagnostic imaging of other specified body structures: Secondary | ICD-10-CM

## 2016-04-16 NOTE — Patient Instructions (Signed)
Chest x-ray today Spirometry pre-and post-on your next visit to establish baseline lung function

## 2016-04-16 NOTE — Progress Notes (Signed)
Subjective:    Patient ID: Andrew Blair, male    DOB: 06-10-1965, 51 y.o.   MRN: [id number]  HPI  51 year old heavy ex-smoker presents for evaluation of pulmonary infiltrate of unclear etiology noted on chest x-ray.  He reports that in 2013 after a chiropractor worked on his chest wall he sustained a fracture-I note a CT chest in 10/2011 which shows fracture of the posterior seventh rib. Since then he reports that he has been very susceptible to chest wall injury.  About 6 weeks ago he reports that well and playful wrestling with his dog, that weighs about 70 pounds, he felt a popping  in his left anterior chest, chest x-ray showed a patchy infiltrate in the left upper lobe. He was told that he had a double pneumonia and treated with antibiotics. For the next few weeks he experienced significant dyspnea and fatigue and pain in his anterior chest. He was given albuterol MDI which he used almost daily for mild intermittent wheezing. However he denies cough and sputum but abduction or hemoptysis. CT chest 03/09/16 showed consolidation in the posterior segment of the left upper lobe and severe segment of left lower lobe consistent with pneumonia. No mass lesion was noted. A 77 mm left adrenal adenoma was also noted. No rib fractures were noted and there were no bone lesions He is very concerned about the initial chest x-ray reading about cancer and would like to get this followed up.  He now feels about 90% improved, does not need albuterol MDI that often, still has to come home from work and light down sometimes. His dyspnea is fully resolved and he denies wheezing. Again he does not have any sputum production  He reports smoking more than a pack per day starting as a teenager, but 30 pack years before he quit in 2014. He  only uses an electronic cigarette which he has stopped using after this episode. 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 Allergy   . Anxiety   . Fatigue    discomfort from hemorrhoids leading to lack of sleep and fatigue   . GERD (gastroesophageal reflux disease)   . Substance abuse   . Thyroid disease    hypothyroid    Past Surgical History:  Procedure Laterality Date  . HAND SURGERY  1984  . SKIN SURGERY  1984   skin graph surgery on left hand     No Known Allergies   Social History   Social History  . Marital status: Married    Spouse name: N/A  . Number of children: N/A  . Years of education: N/A   Occupational History  . Not on file.   Social History Main Topics  . Smoking status: Former Smoker    Packs/day: 0.50    Years: 30.00  . Smokeless tobacco: Current User  . Alcohol use 1.5 oz/week    3 Standard drinks or equivalent per week  . Drug use: No  . Sexual activity: Yes   Other Topics Concern  . Not on file   Social History Narrative   E_Cigarette     Family History  Problem Relation Age of Onset  . Hypertension Mother   . Heart murmur Mother   . Kidney disease Father     stones  . Heart disease Father   . Hyperlipidemia Father   . Cancer Maternal Grandmother   . Heart disease Maternal Grandfather   .  Heart disease Paternal Grandmother   . Heart disease Paternal Grandfather       Review of Systems Constitutional: negative for anorexia, fevers and sweats  Eyes: negative for irritation, redness and visual disturbance  Ears, nose, mouth, throat, and face: negative for earaches, epistaxis, nasal congestion and sore throat  Respiratory: negative for cough, dyspnea on exertion, sputum and wheezing  Cardiovascular: negative for chest pain, dyspnea, lower extremity edema, orthopnea, palpitations and syncope  Gastrointestinal: negative for abdominal pain, constipation, diarrhea, melena, nausea and vomiting  Genitourinary:negative for dysuria, frequency and hematuria  Hematologic/lymphatic: negative for bleeding, easy bruising and lymphadenopathy    Musculoskeletal:negative for arthralgias, muscle weakness and stiff joints  Neurological: negative for coordination problems, gait problems, headaches and weakness  Endocrine: negative for diabetic symptoms including polydipsia, polyuria and weight loss     Objective:   Physical Exam  Gen. Pleasant, well-nourished, in no distress, normal affect ENT - no lesions, no post nasal drip Neck: No JVD, no thyromegaly, no carotid bruits Lungs: No chest wall tenderness, no use of accessory muscles, no dullness to percussion, clear without rales or rhonchi  Cardiovascular: Rhythm regular, heart sounds  normal, no murmurs or gallops, no peripheral edema Abdomen: soft and non-tender, no hepatosplenomegaly, BS normal. Musculoskeletal: No deformities, no cyanosis or clubbing Neuro:  alert, non focal       Assessment & Plan:

## 2016-04-16 NOTE — Assessment & Plan Note (Signed)
His presentation was very atypical - more suggestive for a contusion rather than pneumonia He was treated with antibiotics and feels 90% improved now. It is unclear why he got so sick and imaging only shows a patchy involving less than 20% of the lung. Would need to ensure that underlying lung function is not compromised due to his long history of smoking Would also need to follow this infiltrate  To complete resolution    Chest x-ray today Spirometry pre-and post-on your next visit to establish baseline lung function

## 2016-06-04 ENCOUNTER — Ambulatory Visit: Payer: BLUE CROSS/BLUE SHIELD | Admitting: Pulmonary Disease

## 2016-08-30 ENCOUNTER — Other Ambulatory Visit: Payer: Self-pay | Admitting: Family Medicine

## 2016-09-01 ENCOUNTER — Other Ambulatory Visit: Payer: Self-pay | Admitting: Family Medicine

## 2016-09-04 ENCOUNTER — Ambulatory Visit (INDEPENDENT_AMBULATORY_CARE_PROVIDER_SITE_OTHER): Payer: BLUE CROSS/BLUE SHIELD | Admitting: Family Medicine

## 2016-09-04 ENCOUNTER — Encounter: Payer: Self-pay | Admitting: Family Medicine

## 2016-09-04 VITALS — BP 115/80 | HR 66 | Ht 74.5 in | Wt 219.0 lb

## 2016-09-04 DIAGNOSIS — Z87898 Personal history of other specified conditions: Secondary | ICD-10-CM | POA: Diagnosis not present

## 2016-09-04 DIAGNOSIS — E559 Vitamin D deficiency, unspecified: Secondary | ICD-10-CM

## 2016-09-04 DIAGNOSIS — E079 Disorder of thyroid, unspecified: Secondary | ICD-10-CM | POA: Diagnosis not present

## 2016-09-04 DIAGNOSIS — E663 Overweight: Secondary | ICD-10-CM

## 2016-09-04 DIAGNOSIS — Z8042 Family history of malignant neoplasm of prostate: Secondary | ICD-10-CM

## 2016-09-04 DIAGNOSIS — E781 Pure hyperglyceridemia: Secondary | ICD-10-CM

## 2016-09-04 DIAGNOSIS — R7303 Prediabetes: Secondary | ICD-10-CM | POA: Diagnosis not present

## 2016-09-04 DIAGNOSIS — F1491 Cocaine use, unspecified, in remission: Secondary | ICD-10-CM

## 2016-09-04 DIAGNOSIS — Z1211 Encounter for screening for malignant neoplasm of colon: Secondary | ICD-10-CM | POA: Diagnosis not present

## 2016-09-04 DIAGNOSIS — E782 Mixed hyperlipidemia: Secondary | ICD-10-CM

## 2016-09-04 DIAGNOSIS — D3502 Benign neoplasm of left adrenal gland: Secondary | ICD-10-CM

## 2016-09-04 DIAGNOSIS — Z8249 Family history of ischemic heart disease and other diseases of the circulatory system: Secondary | ICD-10-CM | POA: Insufficient documentation

## 2016-09-04 DIAGNOSIS — Z87891 Personal history of nicotine dependence: Secondary | ICD-10-CM | POA: Diagnosis not present

## 2016-09-04 DIAGNOSIS — R5383 Other fatigue: Secondary | ICD-10-CM

## 2016-09-04 NOTE — Assessment & Plan Note (Signed)
Referral for colonoscopy placed  Come in for CPE and DRE near future

## 2016-09-04 NOTE — Assessment & Plan Note (Addendum)
Pt admits to this today with me for first time.    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   Spent time in [place] at rehab center- clean 5-6 yrs.  - discussed w pt this is new info and I would rec we be even more aggressive with decreasing his CV risk factors even further ---->   - we discussed being a little more aggressive in controlling BP, CHOL panel, quit the nicotine vaping, exercise daily to goal 75min, eat heart healthy diet, cut ETOH intake to NO MORE than 2/d, wt loss to goal BMI less than 25 etc. ( esp since he admitted fam h/o early cad many folks on dad's side of fam)

## 2016-09-04 NOTE — Assessment & Plan Note (Signed)
-->   still currently vaping-  3 mg nicotine currently.     - strongly encouraged to quit-  Pt declines meds and says he will think about what would work best for him.     - He will let me know if he would like script for nicotine patches in near future

## 2016-09-04 NOTE — Assessment & Plan Note (Signed)
Will check FLP near future when pt is fasting

## 2016-09-04 NOTE — Assessment & Plan Note (Signed)
Cont supp.  Recked and stable levels

## 2016-09-04 NOTE — Assessment & Plan Note (Signed)
Importance of yrly DRE d/c pt--> told to make appt for CPE

## 2016-09-04 NOTE — Assessment & Plan Note (Signed)
Will check A1c today.    I rec closer monitoring--> reck q 64mo   Last A1C in the office was:  Lab Results  Component Value Date   HGBA1C 5.8 (H) 08/19/2015

## 2016-09-04 NOTE — Patient Instructions (Addendum)
Please make sure you call Dr. Elsworth Soho office for follow-up with pulmonology.  It's Andrew Mead, MD  Also I want you to start an exercise regimen.  This can be started slowly 15 minutes a day of moderate intensity exercise slowly working her way up to 30- 40 minutes daily.  Your future even come in for fasting blood work.  I'll put in the orders today and I want him make an appointment with Andrew Blair or Andrew Blair before he leaves the office today.  Also make a follow-up in a couple weeks for a yearly physical.  During this time we will go over your lab results and do a physical.

## 2016-09-04 NOTE — Assessment & Plan Note (Signed)
I rec better control of LDL  Lab Results  Component Value Date   LDLCALC 174 (H) 08/19/2015   CREATININE 1.04 03/08/2016   Cont statin for now;  reck near future and adjust dose to get LDL less than 100.

## 2016-09-04 NOTE — Progress Notes (Signed)
Impression and Recommendations:    1. Mixed hyperlipidemia   2. Hypertriglyceridemia   3. Prediabetes   4. Hypothyroidism   5. Vitamin D deficiency   6. History of smoking 10-25 pack years   7. Remote History of cocaine use- for about 10 yrs   8. Colon cancer screening   9. Adrenal adenoma, left   10. Family history of prostate cancer in father- 50 yo at onset   14. Other fatigue   12. Overweight (BMI 25.0-29.9)     Please make sure you call Andrew Blair office for follow-up with pulmonology.  It's Andrew Mead, MD  Also I want you to start an exercise regimen.  This can be started slowly 15 minutes a day of moderate intensity exercise slowly working her way up to 30- 40 minutes daily.  Near future- come in for fasting blood work.  I'll put in the orders today and I want him make an appointment with Andrew Blair or Andrew Blair before he leaves the office today.  Also make a follow-up in a couple weeks for a yearly physical.  During this time we will go over your lab results and do a physical.   Adrenal adenoma, left There is a very small 7 x 7 mm left adrenal adenoma seen on CT chest from Dec '17; but less than 1 cm diam--> so not considered a significant incidentaloma  Family history of prostate cancer in father- 6 yo at onset Importance of yrly DRE d/c pt--> told to make appt for CPE  Fatigue Labs near future- see orders  Vitamin D deficiency Cont supp.  Recked and stable levels  Remote History of cocaine use- for about 10 yrs Pt admits to this today with me for first time.    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   Spent time in [place] at rehab center- clean 5-6 yrs.  - discussed w pt this is new info and I would rec we be even more aggressive with decreasing his CV risk factors even further ---->   - we discussed being a little more aggressive in controlling BP, CHOL panel, quit the nicotine vaping, exercise daily to  goal 37min, eat heart healthy diet, cut ETOH intake to NO MORE than 2/d, wt loss to goal BMI less than 25 etc. ( esp since he admitted fam h/o early cad many folks on dad's side of fam)    History of smoking --> still currently vaping-  3 mg nicotine currently.     - strongly encouraged to quit-  Pt declines meds and says he will think about what would work best for him.     - He will let me know if he would like script for nicotine patches in near future  Hypertriglyceridemia Will check FLP near future when pt is fasting  Prediabetes Will check A1c today.    I rec closer monitoring--> reck q 49mo   Last A1C in the office was:  Lab Results  Component Value Date   HGBA1C 5.8 (H) 08/19/2015      HLD (hyperlipidemia) I rec better control of LDL  Lab Results  Component Value Date   LDLCALC 174 (H) 08/19/2015   CREATININE 1.04 03/08/2016   Cont statin for now;  reck near future and adjust dose to get LDL less than 100.   Colon cancer screening Referral for colonoscopy placed  Come in for CPE and  DRE near future   The patient was counseled, risk factors were discussed, anticipatory guidance given.  Pt was in the office today for 40+ minutes, with over 50% time spent in face to face counseling of patients various medical conditions, treatment plans of those medical conditions including medicine management and lifestyle modification, strategies to improve health and well being; and in coordination of care. SEE ABOVE FOR DETAILS   Orders Placed This Encounter  Procedures  . CBC with Differential/Platelet  . Comprehensive metabolic panel  . HIV antibody  . Hemoglobin A1c  . Lipid panel  . T4, free  . TSH  . VITAMIN D 25 Hydroxy (Vit-D Deficiency, Fractures)  . Vitamin B12  . Ambulatory referral to Gastroenterology     Please see AVS handed out to patient at the end of our visit for further patient instructions/ counseling done pertaining to today's office  visit.   Return in about 6 weeks (around 10/16/2016) for compete physical exam near future.     Note: This document was prepared using Dragon voice recognition software and may include unintentional dictation errors.  Andrew Blair 9:17 PM --------------------------------------------------------------------------------------------------------------------------------------------------------------------------------------------------------------------------------------------    Subjective:    CC:  Chief Complaint  Patient presents with  . Hyperlipidemia  . Hypothyroidism  . Diabetes    HPI: Andrew Blair is a 51 y.o. male who presents to [place] at [facility] today for issues as discussed below.   1)  Seen by Andrew Blair on [phone], the pulmonologist.  Assessment and plan below: "Assessment & Plan Note by Andrew Noel, MD at 04/16/2016 9:56 AM   Author: Rigoberto Noel, MD Author Type: Physician Filed: 04/16/2016 10:03 AM  Note Status: Written Cosign: Cosign Not Required Encounter Date: 04/16/2016  Problem: Abnormal CXR  Editor: Andrew Noel, MD (Physician)    His presentation was very atypical - more suggestive for a contusion rather than pneumonia He was treated with antibiotics and feels 90% improved now. It is unclear why he got so sick and imaging only shows a patchy involving less than 20% of the lung. Would need to ensure that underlying lung function is not compromised due to his long history of smoking Would also need to follow this infiltrate  To complete resolution    Chest x-ray today Spirometry pre-and post-on your next visit to establish baseline lung function     Return in about 6 weeks (around 05/28/2016)."      Pt was lost to f/up with pulmonology.      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  This comes and goes- some days he feels his stamina is good.   It has been this way even since his recent PNA and chest injury - he feels he has not fully recovered from it/ isn't back to baseline yet He does not have any chest pain, jaw pain, nausea or vomiting with exercise relieved with rest, chest tightness or more dyspnea with exertion.  3)   Today pt admits to long h/o heavy cocaine use- never IV - snort and smoked  10+ yrs.  4)  Still smoking vapes --> at 3 mg nicotine daily and drinks ETOH excessively;  No exercise;  Diet- " ok".     5)  CHol-  Tol statin well, no  s-e  6)  Hypothyroidism: tol meds well , no s-e     Wt Readings from Last 3 Encounters:  09/04/16 219 lb (99.3 kg)  04/16/16 214 lb (97.1 kg)  03/23/16 211 lb 11.2 oz (96 kg)   BP Readings from Last 3 Encounters:  09/04/16 115/80  04/16/16 118/68  03/23/16 109/77   Pulse Readings from Last 3 Encounters:  09/04/16 66  04/16/16 74  03/23/16 73   BMI Readings from Last 3 Encounters:  09/04/16 27.74 kg/m  04/16/16 27.11 kg/m  03/23/16 26.82 kg/m     Patient Care Team    Relationship Specialty Notifications Start End  Andrew Dance, DO PCP - General Family Medicine  08/09/15      Patient Active Problem List   Diagnosis Date Noted  . Hypertriglyceridemia 10/08/2015    Priority: High  . HLD (hyperlipidemia) 08/29/2015    Priority: High  . Prediabetes 08/29/2015    Priority: High  . Hypothyroidism 08/29/2015    Priority: High  . Remote History of cocaine use- for about 10 yrs 09/04/2016    Priority: Medium  . Family history of coronary artery disease- Annamarie Major- AMI early 56's; Dad- stents in early 60's) 09/04/2016    Priority: Medium  . History of smoking 09/05/2015    Priority: Medium  . GERD (gastroesophageal reflux disease) 08/29/2015    Priority: Medium  . Irritable bowel syndrome with constipation 08/12/2015    Priority: Medium  . Acute gross stress  reaction / anxiety 08/12/2015    Priority: Medium  . Family history of prostate cancer in father- 85 yo at onset 09/04/2016    Priority: Low  . Adrenal adenoma, left 09/04/2016    Priority: Low  . Fatigue 09/13/2015    Priority: Low  . Vitamin D deficiency 08/29/2015    Priority: Low  . Environmental and seasonal allergies 02/20/2011    Priority: Low  . Colon cancer screening 09/04/2016  . Overweight (BMI 25.0-29.9) 09/04/2016  . Paronychia of finger of right hand 09/08/2015  . Cough 08/09/2015  . Abnormal CXR 08/09/2015  . ETD (eustachian tube dysfunction) 08/09/2015    Past Medical history, Surgical history, Family history, Social history, Allergies and Medications have been entered into the medical record, reviewed and changed as needed.    Current Meds  Medication Sig  . atorvastatin (LIPITOR) 20 MG tablet TAKE 1 TABLET (20 MG TOTAL) BY MOUTH DAILY.  . B Complex Vitamins (B COMPLEX PO) Take by mouth.  . cholecalciferol (VITAMIN D) 1000 units tablet Take 5,000 Units by mouth daily.  . Coenzyme Q10 (CO Q 10 PO) Take by mouth.  Marland Kitchen ibuprofen (ADVIL,MOTRIN) 600 MG tablet Take 600 mg by mouth every 6 (six) hours as needed for moderate pain.  Marland Kitchen levothyroxine (SYNTHROID) 200 MCG tablet Use 187mcg one day, then 260mcg the next and switch doses QOD  . levothyroxine (SYNTHROID, LEVOTHROID) 175 MCG tablet Take 1 tablet (175 mcg total) by mouth daily before breakfast.  . Omega-3 Fatty Acids (FISH OIL) 1000 MG CPDR Take 4 capsules by mouth daily.  . polyethylene glycol (MIRALAX / GLYCOLAX) packet Take 17 g by mouth daily as needed for mild constipation.  . TURMERIC PO Take by mouth.    Allergies:  No Known Allergies   Review of Systems: General:   Denies fever, chills, unexplained weight loss.  Optho/Auditory:   Denies visual changes, blurred vision/LOV Respiratory:   Denies wheeze, DOE more than baseline levels.  Cardiovascular:   Denies chest pain, palpitations,  new onset  peripheral edema  Gastrointestinal:   Denies nausea, vomiting, diarrhea, abd pain.  Genitourinary: Denies dysuria, freq/ urgency, flank pain or discharge from genitals.  Endocrine:     Denies hot or cold intolerance, polyuria, polydipsia. Musculoskeletal:   Denies unexplained myalgias, joint swelling, unexplained arthralgias, gait problems.  Skin:  Denies new onset rash, suspicious lesions Neurological:     Denies dizziness, unexplained weakness, numbness  Psychiatric/Behavioral:   Denies mood changes, suicidal or homicidal ideations, hallucinations    Objective:   Blood pressure 115/80, pulse 66, height 6' 2.5" (1.892 m), weight 219 lb (99.3 kg). Body mass index is 27.74 kg/m. General:  Well Developed, well nourished, appropriate for stated age.  Neuro:  Alert and oriented,  extra-ocular muscles intact  HEENT:  Normocephalic, atraumatic, neck supple, no carotid bruits appreciated  Skin:  no gross rash, warm, pink. Cardiac:  RRR, S1 S2 Respiratory:  ECTA B/L and A/P- no W/R/R, Not using accessory muscles, speaking in full sentences- unlabored. Vascular:  Ext warm, no cyanosis apprec.; cap RF less 2 sec. Psych:  No HI/SI, judgement and insight good, Euthymic mood. Full Affect.

## 2016-09-04 NOTE — Assessment & Plan Note (Signed)
There is a very small 7 x 7 mm left adrenal adenoma seen on CT chest from Dec '17; but less than 1 cm diam--> so not considered a significant incidentaloma

## 2016-09-04 NOTE — Assessment & Plan Note (Signed)
Labs near future- see orders

## 2016-09-07 ENCOUNTER — Other Ambulatory Visit: Payer: BLUE CROSS/BLUE SHIELD

## 2016-09-11 ENCOUNTER — Other Ambulatory Visit (INDEPENDENT_AMBULATORY_CARE_PROVIDER_SITE_OTHER): Payer: BLUE CROSS/BLUE SHIELD

## 2016-09-11 DIAGNOSIS — Z87891 Personal history of nicotine dependence: Secondary | ICD-10-CM | POA: Diagnosis not present

## 2016-09-11 DIAGNOSIS — R7303 Prediabetes: Secondary | ICD-10-CM

## 2016-09-11 DIAGNOSIS — E559 Vitamin D deficiency, unspecified: Secondary | ICD-10-CM

## 2016-09-11 DIAGNOSIS — E782 Mixed hyperlipidemia: Secondary | ICD-10-CM

## 2016-09-11 DIAGNOSIS — E079 Disorder of thyroid, unspecified: Secondary | ICD-10-CM

## 2016-09-11 NOTE — Addendum Note (Signed)
Addended by: Amado Coe on: 09/11/2016 09:28 AM   Modules accepted: Orders

## 2016-09-12 LAB — CBC WITH DIFFERENTIAL/PLATELET
Basophils Absolute: 0.1 10*3/uL (ref 0.0–0.2)
Basos: 1 %
EOS (ABSOLUTE): 0.3 10*3/uL (ref 0.0–0.4)
EOS: 6 %
HEMATOCRIT: 45.6 % (ref 37.5–51.0)
Hemoglobin: 15.2 g/dL (ref 13.0–17.7)
Immature Grans (Abs): 0 10*3/uL (ref 0.0–0.1)
Immature Granulocytes: 0 %
LYMPHS ABS: 1.6 10*3/uL (ref 0.7–3.1)
Lymphs: 32 %
MCH: 30.7 pg (ref 26.6–33.0)
MCHC: 33.3 g/dL (ref 31.5–35.7)
MCV: 92 fL (ref 79–97)
MONOS ABS: 0.4 10*3/uL (ref 0.1–0.9)
Monocytes: 8 %
Neutrophils Absolute: 2.6 10*3/uL (ref 1.4–7.0)
Neutrophils: 53 %
Platelets: 192 10*3/uL (ref 150–379)
RBC: 4.95 x10E6/uL (ref 4.14–5.80)
RDW: 14.1 % (ref 12.3–15.4)
WBC: 4.9 10*3/uL (ref 3.4–10.8)

## 2016-09-12 LAB — LIPID PANEL
CHOL/HDL RATIO: 3.5 ratio (ref 0.0–5.0)
CHOLESTEROL TOTAL: 181 mg/dL (ref 100–199)
HDL: 52 mg/dL (ref >39)
LDL Calculated: 91 mg/dL (ref 0–99)
TRIGLYCERIDES: 188 mg/dL — AB (ref 0–149)
VLDL Cholesterol Cal: 38 mg/dL (ref 5–40)

## 2016-09-12 LAB — COMPREHENSIVE METABOLIC PANEL
A/G RATIO: 1.8 (ref 1.2–2.2)
ALK PHOS: 44 IU/L (ref 39–117)
ALT: 33 IU/L (ref 0–44)
AST: 23 IU/L (ref 0–40)
Albumin: 4.4 g/dL (ref 3.5–5.5)
BILIRUBIN TOTAL: 0.9 mg/dL (ref 0.0–1.2)
BUN/Creatinine Ratio: 15 (ref 9–20)
BUN: 17 mg/dL (ref 6–24)
CO2: 24 mmol/L (ref 20–29)
Calcium: 9.5 mg/dL (ref 8.7–10.2)
Chloride: 104 mmol/L (ref 96–106)
Creatinine, Ser: 1.1 mg/dL (ref 0.76–1.27)
GFR calc Af Amer: 90 mL/min/{1.73_m2} (ref >59)
GFR, EST NON AFRICAN AMERICAN: 78 mL/min/{1.73_m2} (ref >59)
GLOBULIN, TOTAL: 2.4 g/dL (ref 1.5–4.5)
Glucose: 99 mg/dL (ref 65–99)
POTASSIUM: 4.8 mmol/L (ref 3.5–5.2)
SODIUM: 141 mmol/L (ref 134–144)
Total Protein: 6.8 g/dL (ref 6.0–8.5)

## 2016-09-12 LAB — VITAMIN D 25 HYDROXY (VIT D DEFICIENCY, FRACTURES): Vit D, 25-Hydroxy: 37.5 ng/mL (ref 30.0–100.0)

## 2016-09-12 LAB — TSH: TSH: 1.85 u[IU]/mL (ref 0.450–4.500)

## 2016-09-12 LAB — T4, FREE: Free T4: 1.4 ng/dL (ref 0.82–1.77)

## 2016-09-12 LAB — HIV ANTIBODY (ROUTINE TESTING W REFLEX): HIV Screen 4th Generation wRfx: NONREACTIVE

## 2016-09-12 LAB — HEMOGLOBIN A1C
ESTIMATED AVERAGE GLUCOSE: 100 mg/dL
HEMOGLOBIN A1C: 5.1 % (ref 4.8–5.6)

## 2016-09-12 LAB — TESTOSTERONE: Testosterone: 356 ng/dL (ref 264–916)

## 2016-09-12 LAB — VITAMIN B12: Vitamin B-12: 870 pg/mL (ref 232–1245)

## 2016-09-13 ENCOUNTER — Ambulatory Visit (INDEPENDENT_AMBULATORY_CARE_PROVIDER_SITE_OTHER): Payer: BLUE CROSS/BLUE SHIELD | Admitting: Family Medicine

## 2016-09-13 ENCOUNTER — Encounter: Payer: Self-pay | Admitting: Family Medicine

## 2016-09-13 VITALS — BP 127/81 | HR 80 | Ht 74.5 in | Wt 222.5 lb

## 2016-09-13 DIAGNOSIS — E559 Vitamin D deficiency, unspecified: Secondary | ICD-10-CM

## 2016-09-13 DIAGNOSIS — F4323 Adjustment disorder with mixed anxiety and depressed mood: Secondary | ICD-10-CM | POA: Insufficient documentation

## 2016-09-13 MED ORDER — FLUOXETINE HCL 20 MG PO TABS: 20.0000 mg | ORAL_TABLET | Freq: Every day | ORAL | 1 refills | Status: DC

## 2016-09-13 MED ORDER — VITAMIN D (ERGOCALCIFEROL) 1.25 MG (50000 UNIT) PO CAPS: 50000.0000 [IU] | ORAL_CAPSULE | ORAL | 10 refills | Status: DC

## 2016-09-13 NOTE — Progress Notes (Signed)
Male physical  Impression and Recommendations:    1. Vitamin D deficiency   2. Adjustment disorder with mixed anxiety and depressed mood     Patient's Medications  New Prescriptions   FLUOXETINE (PROZAC) 20 MG TABLET    Take 2 tablets (40 mg total) by mouth daily. Caps are ok sub   VITAMIN D, ERGOCALCIFEROL, (DRISDOL) 50000 UNITS CAPS CAPSULE    Take 1 capsule (50,000 Units total) by mouth every 7 (seven) days.  Previous Medications   CHOLECALCIFEROL (VITAMIN D) 1000 UNITS TABLET    Take 5,000 Units by mouth daily.   OMEGA-3 FATTY ACIDS (FISH OIL) 1000 MG CPDR    Take 4 capsules by mouth daily.  Modified Medications   Modified Medication Previous Medication   ATORVASTATIN (LIPITOR) 20 MG TABLET atorvastatin (LIPITOR) 20 MG tablet      Take 1 tablet (20 mg total) by mouth daily.    TAKE 1 TABLET BY MOUTH DAILY.   LEVOTHYROXINE (SYNTHROID, LEVOTHROID) 175 MCG TABLET levothyroxine (SYNTHROID, LEVOTHROID) 175 MCG tablet      Take 1 tablet (175 mcg total) by mouth daily before breakfast. Rotating with the 200 g every other day    TAKE 1 TABLET (175 MCG TOTAL) BY MOUTH DAILY BEFORE BREAKFAST AS DIRECTED.   LEVOTHYROXINE (SYNTHROID, LEVOTHROID) 200 MCG TABLET levothyroxine (SYNTHROID, LEVOTHROID) 200 MCG tablet      Take 1 tablet (200 mcg total) by mouth daily before breakfast. Rotating every other day with the 175    TAKE 1 TABLET BY MOUTH EVERY OTHER DAY ALTERNATING WITH LEVOTHYROXINE 175 MCG TABLET.  Discontinued Medications   ATORVASTATIN (LIPITOR) 20 MG TABLET    TAKE 1 TABLET (20 MG TOTAL) BY MOUTH DAILY.   B COMPLEX VITAMINS (B COMPLEX PO)    Take by mouth.   COENZYME Q10 (CO Q 10 PO)    Take by mouth.   IBUPROFEN (ADVIL,MOTRIN) 600 MG TABLET    Take 600 mg by mouth every 6 (six) hours as needed for moderate pain.   LEVOTHYROXINE (SYNTHROID) 200 MCG TABLET    Use 159mcg one day, then 276mcg the next and switch doses QOD   LEVOTHYROXINE (SYNTHROID, LEVOTHROID) 175 MCG TABLET     Take 1 tablet (175 mcg total) by mouth daily before breakfast.   POLYETHYLENE GLYCOL (MIRALAX / GLYCOLAX) PACKET    Take 17 g by mouth daily as needed for mild constipation.   TURMERIC PO    Take by mouth.     Please see AVS handed out to patient at the end of our visit for further patient instructions/ counseling done pertaining to today's office visit.  1) Anticipatory Guidance: Discussed importance of wearing a seatbelt while driving, not texting while driving;   sunscreen when outside along with skin surveillance; eating a balanced and modest diet; physical activity at least 25 minutes per day or 150 min/ week moderate to intense activity.  2) Immunizations / Screenings / Labs:  All immunizations are up-to-date per recommendations or will be updated today. Patient is due for dental and vision screens which pt will schedule independently. Will obtain CBC, CMP, HgA1c, Lipid panel, TSH and vit D when fasting, if not already done recently.   3) Weight:  BMI meaning discussed with patient.  Discussed goal of losing 5-10% of current body weight which would improve overall feelings of well being and improve objective health data. Improve nutrient density of diet through increasing intake of fruits and vegetables and decreasing saturated fats, white  flour products and refined sugars.    Gross side effects, risk and benefits, and alternatives of medications discussed with patient.  Patient is aware that all medications have potential side effects and we are unable to predict every side effect or drug-drug interaction that may occur.  Expresses verbal understanding and consents to current therapy plan and treatment regimen.  Follow-up preventative CPE in 1 year. Follow-up office visit pending lab work.  F/up sooner for chronic care management and/or prn    Subjective:    CC: CPE  HPI: Andrew Blair is a 51 y.o. male who presents to [place] at [facility] today for a  yearly health maintenance exam.     Health Maintenance Summary Reviewed and updated, unless pt declines services. Tobacco History Reviewed:   y- not current CT scan for screening lung CA:   n/a Abdominal Ultrasound:     ( Unnecessary secondary to < 100 or > 28 years old) Alcohol:    No concerns, no excessive use per pt- sporadic Exercise Habits:   Poor, not reg STD concerns:   None-monogamous Drug Use:   None Birth control method:   n/a Testicular/penile concerns:     none    Health Maintenance  Topic Date Due  . Colon Cancer Screening  09/04/2017*  . Flu Shot  01/22/2018*  . Tetanus Vaccine  12/16/2022  . HIV Screening  Completed  *Topic was postponed. The date shown is not the original due date.     Immunization History  Administered Date(s) Administered  . Tdap 12/15/2012        Health Maintenance  Topic Date Due  . COLONOSCOPY  09/04/2017 (Originally 05/05/2016)  . INFLUENZA VACCINE  01/22/2018 (Originally 10/17/2016)  . TETANUS/TDAP  12/16/2022  . HIV Screening  Completed      Wt Readings from Last 3 Encounters:  05/22/17 223 lb (101.2 kg)  09/24/16 221 lb 12.8 oz (100.6 kg)  09/13/16 222 lb 8 oz (100.9 kg)   BP Readings from Last 3 Encounters:  05/22/17 127/85  09/24/16 115/77  09/13/16 127/81   Pulse Readings from Last 3 Encounters:  05/22/17 73  09/24/16 68  09/13/16 80    Patient Active Problem List   Diagnosis Date Noted  . Hypertriglyceridemia 10/08/2015    Priority: High  . HLD (hyperlipidemia) 08/29/2015    Priority: High  . Prediabetes 08/29/2015    Priority: High  . Hypothyroidism 08/29/2015    Priority: High  . Mood disorder ([place]) 05/22/2017    Priority: Medium  . Remote History of cocaine use- for about 10 yrs 09/04/2016    Priority: Medium  . Family history of coronary artery disease- Annamarie Major- AMI early 46's; Dad- stents in early 60's) 09/04/2016    Priority: Medium  . History of smoking 09/05/2015    Priority: Medium  .  GERD (gastroesophageal reflux disease) 08/29/2015    Priority: Medium  . Irritable bowel syndrome with constipation 08/12/2015    Priority: Medium  . Acute gross stress reaction / anxiety 08/12/2015    Priority: Medium  . Family history of prostate cancer in father- 95 yo at onset 09/04/2016    Priority: Low  . Adrenal adenoma, left 09/04/2016    Priority: Low  . Fatigue 09/13/2015    Priority: Low  . Vitamin D deficiency 08/29/2015    Priority: Low  . Environmental and seasonal allergies 02/20/2011    Priority: Low  . Excessive drinking alcohol-  situational  05/22/2017  .  Blackout spell 05/22/2017  . Low testosterone in male 05/22/2017  . Adjustment disorder with mixed anxiety and depressed mood 09/13/2016  . Colon cancer screening 09/04/2016  . Overweight (BMI 25.0-29.9) 09/04/2016  . Paronychia of finger of right hand 09/08/2015  . Cough 08/09/2015  . Abnormal CXR 08/09/2015  . ETD (eustachian tube dysfunction) 08/09/2015    Past Medical History:  Diagnosis Date  . Allergy   . Anxiety   . Fatigue    discomfort from hemorrhoids leading to lack of sleep and fatigue   . GERD (gastroesophageal reflux disease)   . Substance abuse ([place])   . Thyroid disease    hypothyroid    Past Surgical History:  Procedure Laterality Date  . HAND SURGERY  1984  . SKIN SURGERY  1984   skin graph surgery on left hand     Family History  Problem Relation Age of Onset  . Hypertension Mother   . Heart murmur Mother   . Kidney disease Father        stones  . Heart disease Father   . Hyperlipidemia Father   . Cancer Maternal Grandmother   . Heart disease Maternal Grandfather   . Heart disease Paternal Grandmother   . Heart disease Paternal Grandfather     Social History   Substance and Sexual Activity  Drug Use No  ,  Social History   Substance and Sexual Activity  Alcohol Use Yes  . Alcohol/week: 1.5 oz  . Types: 3 Standard drinks or equivalent per week  ,  Social  History   Tobacco Use  Smoking Status Former Smoker  . Packs/day: 0.50  . Years: 30.00  . Pack years: 15.00  Smokeless Tobacco Former Systems developer  ,  Social History   Substance and Sexual Activity  Sexual Activity Yes    Patient's Medications  New Prescriptions   FLUOXETINE (PROZAC) 20 MG TABLET    Take 2 tablets (40 mg total) by mouth daily. Caps are ok sub   VITAMIN D, ERGOCALCIFEROL, (DRISDOL) 50000 UNITS CAPS CAPSULE    Take 1 capsule (50,000 Units total) by mouth every 7 (seven) days.  Previous Medications   CHOLECALCIFEROL (VITAMIN D) 1000 UNITS TABLET    Take 5,000 Units by mouth daily.   OMEGA-3 FATTY ACIDS (FISH OIL) 1000 MG CPDR    Take 4 capsules by mouth daily.  Modified Medications   Modified Medication Previous Medication   ATORVASTATIN (LIPITOR) 20 MG TABLET atorvastatin (LIPITOR) 20 MG tablet      Take 1 tablet (20 mg total) by mouth daily.    TAKE 1 TABLET BY MOUTH DAILY.   LEVOTHYROXINE (SYNTHROID, LEVOTHROID) 175 MCG TABLET levothyroxine (SYNTHROID, LEVOTHROID) 175 MCG tablet      Take 1 tablet (175 mcg total) by mouth daily before breakfast. Rotating with the 200 g every other day    TAKE 1 TABLET (175 MCG TOTAL) BY MOUTH DAILY BEFORE BREAKFAST AS DIRECTED.   LEVOTHYROXINE (SYNTHROID, LEVOTHROID) 200 MCG TABLET levothyroxine (SYNTHROID, LEVOTHROID) 200 MCG tablet      Take 1 tablet (200 mcg total) by mouth daily before breakfast. Rotating every other day with the 175    TAKE 1 TABLET BY MOUTH EVERY OTHER DAY ALTERNATING WITH LEVOTHYROXINE 175 MCG TABLET.  Discontinued Medications   ATORVASTATIN (LIPITOR) 20 MG TABLET    TAKE 1 TABLET (20 MG TOTAL) BY MOUTH DAILY.   B COMPLEX VITAMINS (B COMPLEX PO)    Take by mouth.   COENZYME Q10 (CO Q 10  PO)    Take by mouth.   IBUPROFEN (ADVIL,MOTRIN) 600 MG TABLET    Take 600 mg by mouth every 6 (six) hours as needed for moderate pain.   LEVOTHYROXINE (SYNTHROID) 200 MCG TABLET    Use 131mcg one day, then 21mcg the next and  switch doses QOD   LEVOTHYROXINE (SYNTHROID, LEVOTHROID) 175 MCG TABLET    Take 1 tablet (175 mcg total) by mouth daily before breakfast.   POLYETHYLENE GLYCOL (MIRALAX / GLYCOLAX) PACKET    Take 17 g by mouth daily as needed for mild constipation.   TURMERIC PO    Take by mouth.    Patient has no known allergies.  Review of Systems: General:   Denies fever, chills, unexplained weight loss.  Optho/Auditory:   Denies visual changes, blurred vision/LOV Respiratory:   Denies SOB, DOE more than baseline levels.  Cardiovascular:   Denies chest pain, palpitations, new onset peripheral edema  Gastrointestinal:   Denies nausea, vomiting, diarrhea.  Genitourinary: Denies dysuria, freq/ urgency, flank pain or discharge from genitals.  Endocrine:     Denies hot or cold intolerance, polyuria, polydipsia. Musculoskeletal:   Denies unexplained myalgias, joint swelling, unexplained arthralgias, gait problems.  Skin:  Denies rash, suspicious lesions Neurological:     Denies dizziness, unexplained weakness, numbness  Psychiatric/Behavioral:   Denies mood changes, suicidal or homicidal ideations, hallucinations    Objective:     Blood pressure 127/81, pulse 80, height 6' 2.5" (1.892 m), weight 222 lb 8 oz (100.9 kg). Body mass index is 28.19 kg/m. General Appearance:    Alert, cooperative, no distress, appears stated age  Head:    Normocephalic, without obvious abnormality, atraumatic  Eyes:    PERRL, conjunctiva/corneas clear, EOM's intact, fundi    benign, both eyes  Ears:    Normal TM's and external ear canals, both ears  Nose:   Nares normal, septum midline, mucosa normal, no drainage    or sinus tenderness  Throat:   Lips w/o lesion, mucosa moist, and tongue normal; teeth and   gums normal  Neck:   Supple, symmetrical, trachea midline, no adenopathy;    thyroid:  no enlargement/tenderness/nodules; no carotid   bruit or JVD  Back:     Symmetric, no curvature, ROM normal, no CVA tenderness    Lungs:     Clear to auscultation bilaterally, respirations unlabored, no       Wh/ R/ R  Chest Wall:    No tenderness or gross deformity; normal excursion   Heart:    Regular rate and rhythm, S1 and S2 normal, no murmur, rub   or gallop  Abdomen:     Soft, non-tender, bowel sounds active all four quadrants, NO   G/R/R, no masses, no organomegaly  Genitalia:    Ext genitalia: without lesion, no penile rash or discharge, no hernias appreciated   Rectal:    Normal tone, prostate WNL's and equal b/l, no tenderness; guaiac negative stool  Extremities:   Extremities normal, atraumatic, no cyanosis or gross edema  Pulses:   2+ and symmetric all extremities  Skin:   Warm, dry, Skin color, texture, turgor normal, no obvious rashes or lesions  M-Sk:   Ambulates * 4 w/o difficulty, no gross deformities, tone WNL  Neurologic:   CNII-XII intact, normal strength, sensation and reflexes    Throughout Psych:  No HI/SI, judgement and insight good, Euthymic mood. Full Affect.

## 2016-09-13 NOTE — Patient Instructions (Signed)
Preventive Care for Adults  A healthy lifestyle and preventive care can promote health and wellness. Preventive health guidelines for men include the following key practices:  .   A routine yearly physical is a good way to check with your health care provider about your health and preventative screening. It is a chance to share any concerns and updates on your health and to receive a thorough exam.  .  Visit your dentist for a routine exam and preventative care every 6 months. Brush your teeth twice a day and floss once a day. Good oral hygiene prevents tooth decay and gum disease.  .  The frequency of eye exams is based on your age, health, family medical history, use of contact lenses, and other factors.  Follow your health care provider's recommendations for frequency of eye exams.  .  Eat a healthy diet.  Foods such as vegetables, fruits, whole grains, low-fat dairy products, and lean protein foods contain the nutrients you need without too many calories.  Decrease your intake of foods high in solid fats, added sugars, and salt.  Eat the right amount of calories for you.  Get information about a proper diet from your health care provider, if necessary.  .  Regular physical exercise is one of the most important things you can do for your health.  Most adults should get at least 150 minutes of moderate-intensity exercise (any activity that increases your heart rate and causes you to sweat) each week.  In addition, most adults need muscle-strengthening exercises on 2 or more days a week.  .  Maintain a healthy weight. The body mass index (BMI) is a screening tool to identify possible weight problems. It provides an estimate of body fat based on height and weight. Your health care provider can find your BMI and can help you achieve or maintain a healthy weight. For adults 20 years and older: A BMI below 18.5 is considered underweight. A BMI of 18.5 to 24.9 is normal. A BMI of 25 to 29.9 is  considered overweight. A BMI of 30 and above is considered obese.  .  Maintain normal blood lipids and cholesterol levels by exercising and minimizing your intake of saturated fat. Eat a balanced diet with plenty of fruit and vegetables. Blood tests for lipids and cholesterol should begin at age 84 and be repeated every 5 years. If your lipid or cholesterol levels are high, you are over 50, or you are at high risk for heart disease, you may need your cholesterol levels checked more frequently. Ongoing high lipid and cholesterol levels should be treated with medicines if diet and exercise are not working.  .  If you smoke, find out from your health care provider how to quit. If you do not use tobacco, do not start.  . If you choose to drink alcohol, do not have more than 2 drinks per day. One drink is considered to be 12 ounces (355 mL) of beer, 5 ounces (148 mL) of wine, or 1.5 ounces (44 mL) of liquor.  Marland Kitchen Avoid use of street drugs. Do not share needles with anyone. Ask for help if you need support or instructions about stopping the use of drugs.  . High blood pressure causes heart disease and increases the risk of stroke. Your blood pressure should be checked at least every 1-2 years. Ongoing high blood pressure should be treated with medicines, if weight loss and exercise are not effective.  . If you are 84-61 years old,  ask your health care provider if you should take aspirin to prevent heart disease.  . Diabetes screening involves taking a blood sample to check your fasting blood sugar level.  This should be done once every 3 years, after age 19, if you are within normal weight and without risk factors for diabetes.  Testing should be considered at a younger age or be carried out more frequently if you are overweight and have at least 1 risk factor for diabetes.  . Colorectal cancer can be detected and often prevented. Most routine colorectal cancer screening begins at the age of 51 and  continues through age 1. However, your health care provider may recommend screening at an earlier age if you have risk factors for colon cancer. On a yearly basis, your health care provider may provide home test kits to check for hidden blood in the stool. Use of a small camera at the end of a tube to directly examine the colon (sigmoidoscopy or colonoscopy) can detect the earliest forms of colorectal cancer. Talk to your health care provider about this at age 31, when routine screening begins. Direct exam of the colon should be repeated every 5-10 years through age 90, unless early forms of precancerous polyps or small growths are found.  .  Lung cancer screening is recommended for adults aged 47-80 years who are at high risk for developing lung cancer because of a history of smoking. A yearly low-dose CT scan of the lungs is recommended for people who have at least a 30-pack-year history of smoking and are a current smoker or have quit within the past 15 years. A pack year of smoking is smoking an average of 1 pack of cigarettes a day for 1 year (for example: 1 pack a day for 30 years or 2 packs a day for 15 years). Yearly screening should continue until the smoker has stopped smoking for at least 15 years. Yearly screening should be stopped for people who develop a health problem that would prevent them from having lung cancer treatment.  . Talk with your health care provider about prostate cancer screening.  . Testicular cancer screening is recommended for adult males. Screening includes self-exam and a health care provider exam. Consult with your health care provider about any symptoms you have or any concerns you have about testicular cancer.  . Use sunscreen. Apply sunscreen liberally and repeatedly throughout the day. You should seek shade when your shadow is shorter than you. Protect yourself by wearing long sleeves, pants, a wide-brimmed hat, and sunglasses year round, whenever you are  outdoors.  . Once a month, do a whole-body skin exam, using a mirror to look at the skin on your back. Tell your health care provider about new moles, moles that have irregular borders, moles that are larger than a pencil eraser, or moles that have changed in shape or color.    ++++++++++++++++++++++++++++++++++++++++++++++++++++++++++++++++++  Stay current with required vaccines (immunizations).  ? Influenza vaccine. All adults should be immunized every year.  ? Tetanus, diphtheria, and acellular pertussis (Td, Tdap) vaccine. An adult who has not previously received Tdap or who does not know his vaccine status should receive 1 dose of Tdap. This initial dose should be followed by tetanus and diphtheria toxoids (Td) booster doses every 10 years. Adults with an unknown or incomplete history of completing a 3-dose immunization series with Td-containing vaccines should begin or complete a primary immunization series including a Tdap dose. Adults should receive a Td booster every  10 years.  ? Varicella vaccine. An adult without evidence of immunity to varicella should receive 2 doses or a second dose if he has previously received 1 dose.  ? Human papillomavirus (HPV) vaccine. Males aged 27-21 years who have not received the vaccine previously should receive the 3-dose series. Males aged 22-26 years may be immunized. Immunization is recommended through the age of 12 years for any male who has sex with males and did not get any or all doses earlier. Immunization is recommended for any person with an immunocompromised condition through the age of 75 years if he did not get any or all doses earlier. During the 3-dose series, the second dose should be obtained 4-8 weeks after the first dose. The third dose should be obtained 24 weeks after the first dose and 16 weeks after the second dose.  ? Zoster vaccine. One dose is recommended for adults aged 36 years or older unless certain conditions are  present.   ? PREVNAR - Pneumococcal 13-valent conjugate (PCV13) vaccine. When indicated, a person who is uncertain of his immunization history and has no record of immunization should receive the PCV13 vaccine. An adult aged 59 years or older who has certain medical conditions and has not been previously immunized should receive 1 dose of PCV13 vaccine. This PCV13 should be followed with a dose of pneumococcal polysaccharide (PPSV23) vaccine. The PPSV23 vaccine dose should be obtained at least 8 weeks after the dose of PCV13 vaccine. An adult aged 5 years or older who has certain medical conditions and previously received 1 or more doses of PPSV23 vaccine should receive 1 dose of PCV13. The PCV13 vaccine dose should be obtained 1 or more years after the last PPSV23 vaccine dose.   ? PNEUMOVAX - Pneumococcal polysaccharide (PPSV23) vaccine. When PCV13 is also indicated, PCV13 should be obtained first. All adults aged 47 years and older should be immunized. An adult younger than age 51 years who has certain medical conditions should be immunized. Any person who resides in a nursing home or long-term care facility should be immunized. An adult smoker should be immunized. People with an immunocompromised condition and certain other conditions should receive both PCV13 and PPSV23 vaccines. People with human immunodeficiency virus (HIV) infection should be immunized as soon as possible after diagnosis. Immunization during chemotherapy or radiation therapy should be avoided. Routine use of PPSV23 vaccine is not recommended for American Indians, [place] Natives, or people younger than 65 years unless there are medical conditions that require PPSV23 vaccine. When indicated, people who have unknown immunization and have no record of immunization should receive PPSV23 vaccine. One-time revaccination 5 years after the first dose of PPSV23 is recommended for people aged 19-64 years who have chronic kidney failure,  nephrotic syndrome, asplenia, or immunocompromised conditions. People who received 1-2 doses of PPSV23 before age 59 years should receive another dose of PPSV23 vaccine at age 64 years or later if at least 5 years have passed since the previous dose. Doses of PPSV23 are not needed for people immunized with PPSV23 at or after age 18 years.   ? Hepatitis A vaccine. Adults who wish to be protected from this disease, have certain high-risk conditions, work with hepatitis A-infected animals, work in hepatitis A research labs, or travel to or work in countries with a high rate of hepatitis A should be immunized. Adults who were previously unvaccinated and who anticipate close contact with an international adoptee during the first 60 days after arrival in the  [place] from a country with a high rate of hepatitis A should be immunized.  ? Hepatitis B vaccine. Adults should be immunized if they wish to be protected from this disease, have certain high-risk conditions, may be exposed to blood or other infectious body fluids, are household contacts or sex partners of hepatitis B positive people, are clients or workers in certain care facilities, or travel to or work in countries with a high rate of hepatitis B.     Preventive Service / Frequency  Ages 77 to 61  Blood pressure check.  Lipid and cholesterol check  Lung cancer screening. / Every year if you are aged 102-80 years and have a 30-pack-year history of smoking and currently smoke or have quit within the past 15 years. Yearly screening is stopped once you have quit smoking for at least 15 years or develop a health problem that would prevent you from having lung cancer treatment.  Fecal occult blood test (FOBT) of stool. / Every year beginning at age 21 and continuing until age 76. You may not have to do this test if you get a colonoscopy every 10 years.  Flexible sigmoidoscopy** or colonoscopy.** / Every 5 years for a flexible sigmoidoscopy  or every 10 years for a colonoscopy beginning at age 53 and continuing until age 43. Screening for abdominal aortic aneurysm (AAA) by ultrasound is recommended for people who have history of high blood pressure or who are current or former smokers.  ++++++++++++++++++++++++++++++++++++++++++++++++++++++++++++++  Recommend Adult Low Dose Aspirin or coated Aspirin 81 mg daily To reduce risk of Colon Cancer 20 % Skin Cancer 26 %  Melanoma 46% and Pancreatic cancer 60%  +++++++++++++++++++++++++++++++++++++++++++++++++++++++++++++  Vitamin D goal is between 50-100. Please make sure that you are taking your Vitamin D as directed.  It is very important as a natural anti-inflammatory - helping with muscle and joint aches; as well as helping hair, skin, and nails; as well as reducing stroke, heart attack and cancer risk. It helps your bones and helps with mood. It also decreases numerous cancer risks so please take it as directed.  - Low Vit D is associated with a 200-300% higher risk for CANCER and 200-300% higher risk for HEART ATTACK & STROKE.  It is also associated with higher death rate at younger ages, autoimmune diseases like Rheumatoid arthritis, Lupus, Multiple Sclerosis; also many other serious conditions, like depression, Alzheimer's Dementia, infertility, muscle aches, fatigue, fibromyalgia - just to name a few.  +++++++++++++++++++++++++++++++++++++++++++++++++++++++++++  Recommend the book "The END of DIETING" by Dr Excell Seltzer & the book "The END of DIABETES " by Dr Excell Seltzer At [email or web address] - get book & Audio CD's   --->Being diabetic has a 300% increased risk for heart attack, stroke, cancer, and alzheimer- type vascular dementia. It is very important that you work harder with diet by avoiding all foods that are white. Avoid white rice (brown & wild rice is OK), white potatoes (sweet potatoes in moderation is OK), White bread or wheat bread or anything made out of white  flour like bagels, donuts, rolls, buns, biscuits, cakes, pastries, cookies, pizza crust, and pasta (made from white flour & egg whites) - vegetarian pasta or spinach or wheat pasta is OK. Multigrain breads like Arnold's or [employer], or multigrain sandwich thins or flatbreads. Diet, exercise and weight loss can reverse and cure diabetes in the early stages. Diet, exercise and weight loss is very important in the control and prevention of complications of diabetes which affects  every system in your body, ie. Brain - dementia/stroke, eyes - glaucoma/blindness, heart - heart attack/heart failure, kidneys - dialysis, stomach - gastric paralysis, intestines - malabsorption, nerves - severe painful neuritis, circulation - gangrene & loss of a leg(s), and finally cancer and Alzheimers.  I recommend avoid fried & greasy foods, sweets/candy, white rice (brown or wild rice or Quinoa is OK), white potatoes (sweet potatoes are OK) - anything made from white flour - bagels, doughnuts, rolls, buns, biscuits,white and wheat breads, pizza crust and traditional pasta made of white flour & egg white(vegetarian pasta or spinach or wheat pasta is OK). Multi-grain bread is OK - like multi-grain flat bread or sandwich thins. Avoid alcohol in excess.  Exercise is also important. Eat all the vegetables you want - avoid fatty meats, especially red meat and dairy - especially cheese. Cheese is the most concentrated form of trans-fats which is the worst thing to clog up our arteries. Veggie cheese is OK which can be found in the fresh produce section at [employer] or [employer] or [employer].  ++++++++++++++++++++++ DASH Eating Plan  DASH stands for "Dietary Approaches to Stop Hypertension."  The DASH eating plan is a healthy eating plan that has been shown to reduce high blood pressure (hypertension). Additional health benefits may include reducing the risk of type 2 diabetes mellitus, heart disease, and stroke. The DASH  eating plan may also help with weight loss.  WHAT DO I NEED TO KNOW ABOUT THE DASH EATING PLAN? For the DASH eating plan, you will follow these general guidelines: . Choose foods with a percent daily value for sodium of less than 5% (as listed on the food label). . Use salt-free seasonings or herbs instead of table salt or sea salt. . Check with your health care provider or pharmacist before using salt substitutes. . Eat lower-sodium products, often labeled as "lower sodium" or "no salt added." . Eat fresh foods. . Eat more vegetables, fruits, and low-fat dairy products. . Choose whole grains. Look for the word "whole" as the first word in the ingredient list. . Choose fish . Limit sweets, desserts, sugars, and sugary drinks. . Choose heart-healthy fats. . Eat veggie cheese . Eat more home-cooked food and less restaurant, buffet, and fast food. . Limit fried foods. Lacinda Axon foods using methods other than frying. . Limit canned vegetables. If you do use them, rinse them well to decrease the sodium. . When eating at a restaurant, ask that your food be prepared with less salt, or no salt if possible.   WHAT FOODS CAN I EAT? Read Dr Fara Olden Fuhrman's books on The End of Dieting & The End of Diabetes  Grains Whole grain or whole wheat bread. Brown rice. Whole grain or whole wheat pasta. Quinoa, bulgur, and whole grain cereals. Low-sodium cereals. Corn or whole wheat flour tortillas. Whole grain cornbread. Whole grain crackers. Low-sodium crackers.  Vegetables Fresh or frozen vegetables (raw, steamed, roasted, or grilled). Low-sodium or reduced-sodium tomato and vegetable juices. Low-sodium or reduced-sodium tomato sauce and paste. Low-sodium or reduced-sodium canned vegetables.  Fruits All fresh, canned (in natural juice), or frozen fruits.  Protein Products All fish and seafood. Dried beans, peas, or lentils. Unsalted nuts and seeds. Unsalted canned beans.  Dairy Low-fat  dairy products, such as skim or 1% milk, 2% or reduced-fat cheeses, low-fat ricotta or cottage cheese, or plain low-fat yogurt. Low-sodium or reduced-sodium cheeses.  Fats and Oils Tub margarines without trans fats. Light or reduced-fat mayonnaise and salad dressings (  reduced sodium). Avocado. Safflower, olive, or canola oils. Natural peanut or almond butter.  Other Unsalted popcorn and pretzels. The items listed above may not be a complete list of recommended foods or beverages. Contact your dietitian for more options.  ++++++++++++++++++++++++++++++++++++++++++++++++++++++++++++++++  WHAT FOODS ARE NOT RECOMMENDED?  Grains/ White flour or wheat flour White bread. White pasta. White rice. Refined cornbread. Bagels and croissants. Crackers that contain trans fat. Vegetables Creamed or fried vegetables. Vegetables in a . Regular canned vegetables. Regular canned tomato sauce and paste. Regular tomato and vegetable juices. Fruits Dried fruits. Canned fruit in light or heavy syrup. Fruit juice. Meat and Other Protein Products Meat in general - RED meat & White meat. Fatty cuts of meat. Ribs, chicken wings, all processed meats as bacon, sausage, bologna, salami, fatback, hot dogs, bratwurst and packaged luncheon meats. Dairy Whole or 2% milk, cream, half-and-half, and cream cheese. Whole-fat or sweetened yogurt. Full-fat cheeses or blue cheese. Non-dairy creamers and whipped toppings. Processed cheese, cheese spreads, or cheese curds.  Condiments Onion and garlic salt, seasoned salt, table salt, and sea salt. Canned and packaged gravies. Worcestershire sauce. Tartar sauce. Barbecue sauce. Teriyaki sauce. Soy sauce, including reduced sodium. Steak sauce. Fish sauce. Oyster sauce. Cocktail sauce. Horseradish. Ketchup and mustard. Meat flavorings and tenderizers. Bouillon cubes. Hot sauce. Tabasco sauce. Marinades. Taco seasonings. Relishes. Fats and Oils Butter, stick margarine, lard, shortening  and bacon fat. Coconut, palm kernel, or palm oils. Regular salad dressings. Pickles and olives. Salted popcorn and pretzels. The items listed above may not be a complete list of foods and beverages to avoid.    Preventive Care for Adults, Male A healthy lifestyle and preventive care can promote health and wellness. Preventive health guidelines for men include the following key practices:  A routine yearly physical is a good way to check with your health care provider about your health and preventative screening. It is a chance to share any concerns and updates on your health and to receive a thorough exam.  Visit your dentist for a routine exam and preventative care every 6 months. Brush your teeth twice a day and floss once a day. Good oral hygiene prevents tooth decay and gum disease.  The frequency of eye exams is based on your age, health, family medical history, use of contact lenses, and other factors. Follow your health care provider's recommendations for frequency of eye exams.  Eat a healthy diet. Foods such as vegetables, fruits, whole grains, low-fat dairy products, and lean protein foods contain the nutrients you need without too many calories. Decrease your intake of foods high in solid fats, added sugars, and salt. Eat the right amount of calories for you.Get information about a proper diet from your health care provider, if necessary.  Regular physical exercise is one of the most important things you can do for your health. Most adults should get at least 150 minutes of moderate-intensity exercise (any activity that increases your heart rate and causes you to sweat) each week. In addition, most adults need muscle-strengthening exercises on 2 or more days a week.  Maintain a healthy weight. The body mass index (BMI) is a screening tool to identify possible weight problems. It provides an estimate of body fat based on height and weight. Your health care provider can find your BMI and  can help you achieve or maintain a healthy weight.For adults 20 years and older:  A BMI below 18.5 is considered underweight.  A BMI of 18.5 to 24.9 is normal.  A BMI of 25 to 29.9 is considered overweight.  A BMI of 30 and above is considered obese.  Maintain normal blood lipids and cholesterol levels by exercising and minimizing your intake of saturated fat. Eat a balanced diet with plenty of fruit and vegetables. Blood tests for lipids and cholesterol should begin at age 26 and be repeated every 5 years. If your lipid or cholesterol levels are high, you are over 50, or you are at high risk for heart disease, you may need your cholesterol levels checked more frequently.Ongoing high lipid and cholesterol levels should be treated with medicines if diet and exercise are not working.  If you smoke, find out from your health care provider how to quit. If you do not use tobacco, do not start.  Lung cancer screening is recommended for adults aged 22-80 years who are at high risk for developing lung cancer because of a history of smoking. A yearly low-dose CT scan of the lungs is recommended for people who have at least a 30-pack-year history of smoking and are a current smoker or have quit within the past 15 years. A pack year of smoking is smoking an average of 1 pack of cigarettes a day for 1 year (for example: 1 pack a day for 30 years or 2 packs a day for 15 years). Yearly screening should continue until the smoker has stopped smoking for at least 15 years. Yearly screening should be stopped for people who develop a health problem that would prevent them from having lung cancer treatment.  If you choose to drink alcohol, do not have more than 2 drinks per day. One drink is considered to be 12 ounces (355 mL) of beer, 5 ounces (148 mL) of wine, or 1.5 ounces (44 mL) of liquor.  Avoid use of street drugs. Do not share needles with anyone. Ask for help if you need support or instructions about  stopping the use of drugs.  High blood pressure causes heart disease and increases the risk of stroke. Your blood pressure should be checked at least every 1-2 years. Ongoing high blood pressure should be treated with medicines, if weight loss and exercise are not effective.  If you are 15-59 years old, ask your health care provider if you should take aspirin to prevent heart disease.  Diabetes screening is done by taking a blood sample to check your blood glucose level after you have not eaten for a certain period of time (fasting). If you are not overweight and you do not have risk factors for diabetes, you should be screened once every 3 years starting at age 35. If you are overweight or obese and you are 73-49 years of age, you should be screened for diabetes every year as part of your cardiovascular risk assessment.  Colorectal cancer can be detected and often prevented. Most routine colorectal cancer screening begins at the age of 65 and continues through age 71. However, your health care provider may recommend screening at an earlier age if you have risk factors for colon cancer. On a yearly basis, your health care provider may provide home test kits to check for hidden blood in the stool. Use of a small camera at the end of a tube to directly examine the colon (sigmoidoscopy or colonoscopy) can detect the earliest forms of colorectal cancer. Talk to your health care provider about this at age 76, when routine screening begins. Direct exam of the colon should be repeated every 5-10 years through age 83,  unless early forms of precancerous polyps or small growths are found.  People who are at an increased risk for hepatitis B should be screened for this virus. You are considered at high risk for hepatitis B if:  You were born in a country where hepatitis B occurs often. Talk with your health care provider about which countries are considered high risk.  Your parents were born in a high-risk  country and you have not received a shot to protect against hepatitis B (hepatitis B vaccine).  You have HIV or AIDS.  You use needles to inject street drugs.  You live with, or have sex with, someone who has hepatitis B.  You are a man who has sex with other men (MSM).  You get hemodialysis treatment.  You take certain medicines for conditions such as cancer, organ transplantation, and autoimmune conditions.  Hepatitis C blood testing is recommended for all people born from [birth year] through 1965 and any individual with known risks for hepatitis C.  Practice safe sex. Use condoms and avoid high-risk sexual practices to reduce the spread of sexually transmitted infections (STIs). STIs include gonorrhea, chlamydia, syphilis, trichomonas, herpes, HPV, and human immunodeficiency virus (HIV). Herpes, HIV, and HPV are viral illnesses that have no cure. They can result in disability, cancer, and death.  If you are a man who has sex with other men, you should be screened at least once per year for:  HIV.  Urethral, rectal, and pharyngeal infection of gonorrhea, chlamydia, or both.  If you are at risk of being infected with HIV, it is recommended that you take a prescription medicine daily to prevent HIV infection. This is called preexposure prophylaxis (PrEP). You are considered at risk if:  You are a man who has sex with other men (MSM) and have other risk factors.  You are a heterosexual man, are sexually active, and are at increased risk for HIV infection.  You take drugs by injection.  You are sexually active with a partner who has HIV.  Talk with your health care provider about whether you are at high risk of being infected with HIV. If you choose to begin PrEP, you should first be tested for HIV. You should then be tested every 3 months for as long as you are taking PrEP.  A one-time screening for abdominal aortic aneurysm (AAA) and surgical repair of large AAAs by ultrasound are  recommended for men ages 24 to 50 years who are current or former smokers.  Healthy men should no longer receive prostate-specific antigen (PSA) blood tests as part of routine cancer screening. Talk with your health care provider about prostate cancer screening.  Testicular cancer screening is not recommended for adult males who have no symptoms. Screening includes self-exam, a health care provider exam, and other screening tests. Consult with your health care provider about any symptoms you have or any concerns you have about testicular cancer.  Use sunscreen. Apply sunscreen liberally and repeatedly throughout the day. You should seek shade when your shadow is shorter than you. Protect yourself by wearing long sleeves, pants, a wide-brimmed hat, and sunglasses year round, whenever you are outdoors.  Once a month, do a whole-body skin exam, using a mirror to look at the skin on your back. Tell your health care provider about new moles, moles that have irregular borders, moles that are larger than a pencil eraser, or moles that have changed in shape or color.  Stay current with required vaccines (immunizations).  Influenza vaccine.  All adults should be immunized every year.  Tetanus, diphtheria, and acellular pertussis (Td, Tdap) vaccine. An adult who has not previously received Tdap or who does not know his vaccine status should receive 1 dose of Tdap. This initial dose should be followed by tetanus and diphtheria toxoids (Td) booster doses every 10 years. Adults with an unknown or incomplete history of completing a 3-dose immunization series with Td-containing vaccines should begin or complete a primary immunization series including a Tdap dose. Adults should receive a Td booster every 10 years.  Varicella vaccine. An adult without evidence of immunity to varicella should receive 2 doses or a second dose if he has previously received 1 dose.  Human papillomavirus (HPV) vaccine. Males aged 11-21  years who have not received the vaccine previously should receive the 3-dose series. Males aged 22-26 years may be immunized. Immunization is recommended through the age of 52 years for any male who has sex with males and did not get any or all doses earlier. Immunization is recommended for any person with an immunocompromised condition through the age of 60 years if he did not get any or all doses earlier. During the 3-dose series, the second dose should be obtained 4-8 weeks after the first dose. The third dose should be obtained 24 weeks after the first dose and 16 weeks after the second dose.  Zoster vaccine. One dose is recommended for adults aged 33 years or older unless certain conditions are present.  Measles, mumps, and rubella (MMR) vaccine. Adults born before [birth year] generally are considered immune to measles and mumps. Adults born in [birth year] or later should have 1 or more doses of MMR vaccine unless there is a contraindication to the vaccine or there is laboratory evidence of immunity to each of the three diseases. A routine second dose of MMR vaccine should be obtained at least 28 days after the first dose for students attending postsecondary schools, health care workers, or international travelers. People who received inactivated measles vaccine or an unknown type of measles vaccine during 1963-1967 should receive 2 doses of MMR vaccine. People who received inactivated mumps vaccine or an unknown type of mumps vaccine before 1979 and are at high risk for mumps infection should consider immunization with 2 doses of MMR vaccine. Unvaccinated health care workers born before [birth year] who lack laboratory evidence of measles, mumps, or rubella immunity or laboratory confirmation of disease should consider measles and mumps immunization with 2 doses of MMR vaccine or rubella immunization with 1 dose of MMR vaccine.  Pneumococcal 13-valent conjugate (PCV13) vaccine. When indicated, a person who is uncertain of his  immunization history and has no record of immunization should receive the PCV13 vaccine. All adults 9 years of age and older should receive this vaccine. An adult aged 52 years or older who has certain medical conditions and has not been previously immunized should receive 1 dose of PCV13 vaccine. This PCV13 should be followed with a dose of pneumococcal polysaccharide (PPSV23) vaccine. Adults who are at high risk for pneumococcal disease should obtain the PPSV23 vaccine at least 8 weeks after the dose of PCV13 vaccine. Adults older than 50 years of age who have normal immune system function should obtain the PPSV23 vaccine dose at least 1 year after the dose of PCV13 vaccine.  Pneumococcal polysaccharide (PPSV23) vaccine. When PCV13 is also indicated, PCV13 should be obtained first. All adults aged 81 years and older should be immunized. An adult younger than age 65 years who has  certain medical conditions should be immunized. Any person who resides in a nursing home or long-term care facility should be immunized. An adult smoker should be immunized. People with an immunocompromised condition and certain other conditions should receive both PCV13 and PPSV23 vaccines. People with human immunodeficiency virus (HIV) infection should be immunized as soon as possible after diagnosis. Immunization during chemotherapy or radiation therapy should be avoided. Routine use of PPSV23 vaccine is not recommended for American Indians, [place] Natives, or people younger than 65 years unless there are medical conditions that require PPSV23 vaccine. When indicated, people who have unknown immunization and have no record of immunization should receive PPSV23 vaccine. One-time revaccination 5 years after the first dose of PPSV23 is recommended for people aged 19-64 years who have chronic kidney failure, nephrotic syndrome, asplenia, or immunocompromised conditions. People who received 1-2 doses of PPSV23 before age 31 years should  receive another dose of PPSV23 vaccine at age 77 years or later if at least 5 years have passed since the previous dose. Doses of PPSV23 are not needed for people immunized with PPSV23 at or after age 73 years.  Meningococcal vaccine. Adults with asplenia or persistent complement component deficiencies should receive 2 doses of quadrivalent meningococcal conjugate (MenACWY-D) vaccine. The doses should be obtained at least 2 months apart. Microbiologists working with certain meningococcal bacteria, [employer] recruits, people at risk during an outbreak, and people who travel to or live in countries with a high rate of meningitis should be immunized. A first-year college student up through age 53 years who is living in a residence hall should receive a dose if he did not receive a dose on or after his 16th birthday. Adults who have certain high-risk conditions should receive one or more doses of vaccine.  Hepatitis A vaccine. Adults who wish to be protected from this disease, have chronic liver disease, work with hepatitis A-infected animals, work in hepatitis A research labs, or travel to or work in countries with a high rate of hepatitis A should be immunized. Adults who were previously unvaccinated and who anticipate close contact with an international adoptee during the first 60 days after arrival in the [place] from a country with a high rate of hepatitis A should be immunized.  Hepatitis B vaccine. Adults should be immunized if they wish to be protected from this disease, are under age 37 years and have diabetes, have chronic liver disease, have had more than one sex partner in the past 6 months, may be exposed to blood or other infectious body fluids, are household contacts or sex partners of hepatitis B positive people, are clients or workers in certain care facilities, or travel to or work in countries with a high rate of hepatitis B.  Haemophilus influenzae type b (Hib) vaccine. A previously  unvaccinated person with asplenia or sickle cell disease or having a scheduled splenectomy should receive 1 dose of Hib vaccine. Regardless of previous immunization, a recipient of a hematopoietic stem cell transplant should receive a 3-dose series 6-12 months after his successful transplant. Hib vaccine is not recommended for adults with HIV infection. Ages 26 to 37  Blood pressure check.** / Every year.  Lipid and cholesterol check.** / Every 5 years beginning at age 20.  Lung cancer screening. / Every year if you are aged 40-80 years and have a 30-pack-year history of smoking and currently smoke or have quit within the past 15 years. Yearly screening is stopped once you have quit smoking for at  least 15 years or develop a health problem that would prevent you from having lung cancer treatment.  Fecal occult blood test (FOBT) of stool. / Every year beginning at age 4 and continuing until age 49. You may not have to do this test if you get a colonoscopy every 10 years.  Flexible sigmoidoscopy** or colonoscopy.** / Every 5 years for a flexible sigmoidoscopy or every 10 years for a colonoscopy beginning at age 61 and continuing until age 64.  Hepatitis C blood test.** / For all people born from [birth year] through 1964 and any individual with known risks for hepatitis C.  Skin self-exam. / Monthly.  Influenza vaccine. / Every year.  Tetanus, diphtheria, and acellular pertussis (Tdap/Td) vaccine.** / Consult your health care provider. 1 dose of Td every 10 years.  Varicella vaccine.** / Consult your health care provider.  Zoster vaccine.** / 1 dose for adults aged 87 years or older.  Measles, mumps, rubella (MMR) vaccine.** / You need at least 1 dose of MMR if you were born in 1956 or later. You may also need a second dose.  Pneumococcal 13-valent conjugate (PCV13) vaccine.** / Consult your health care provider.  Pneumococcal polysaccharide (PPSV23) vaccine.** / 1 to 2 doses if you smoke  cigarettes or if you have certain conditions.  Meningococcal vaccine.** / Consult your health care provider.  Hepatitis A vaccine.** / Consult your health care provider.  Hepatitis B vaccine.** / Consult your health care provider.  Haemophilus influenzae type b (Hib) vaccine.** / Consult your health care provider.   This information is not intended to replace advice given to you by your health care provider. Make sure you discuss any questions you have with your health care provider.   Document Released: 05/01/2001 Document Revised: 03/26/2014 Document Reviewed: 07/31/2010 Elsevier Interactive Patient Education [employer].

## 2016-09-24 ENCOUNTER — Other Ambulatory Visit: Payer: Self-pay | Admitting: Family Medicine

## 2016-09-24 ENCOUNTER — Encounter: Payer: Self-pay | Admitting: Family Medicine

## 2016-09-24 ENCOUNTER — Ambulatory Visit (INDEPENDENT_AMBULATORY_CARE_PROVIDER_SITE_OTHER): Payer: BLUE CROSS/BLUE SHIELD | Admitting: Family Medicine

## 2016-09-24 VITALS — BP 115/77 | HR 68 | Temp 98.1°F | Ht 74.5 in | Wt 221.8 lb

## 2016-09-24 DIAGNOSIS — F4323 Adjustment disorder with mixed anxiety and depressed mood: Secondary | ICD-10-CM | POA: Diagnosis not present

## 2016-09-24 DIAGNOSIS — E079 Disorder of thyroid, unspecified: Secondary | ICD-10-CM

## 2016-09-24 DIAGNOSIS — H6691 Otitis media, unspecified, right ear: Secondary | ICD-10-CM

## 2016-09-24 DIAGNOSIS — L700 Acne vulgaris: Secondary | ICD-10-CM | POA: Diagnosis not present

## 2016-09-24 DIAGNOSIS — H6980 Other specified disorders of Eustachian tube, unspecified ear: Secondary | ICD-10-CM

## 2016-09-24 DIAGNOSIS — E782 Mixed hyperlipidemia: Secondary | ICD-10-CM

## 2016-09-24 MED ORDER — LEVOTHYROXINE SODIUM 175 MCG PO TABS: 175.0000 ug | ORAL_TABLET | Freq: Every day | ORAL | 1 refills | Status: DC

## 2016-09-24 MED ORDER — ATORVASTATIN CALCIUM 20 MG PO TABS: 20.0000 mg | ORAL_TABLET | Freq: Every day | ORAL | 1 refills | Status: DC

## 2016-09-24 MED ORDER — LEVOTHYROXINE SODIUM 200 MCG PO TABS: 200.0000 ug | ORAL_TABLET | Freq: Every day | ORAL | 1 refills | Status: DC

## 2016-09-24 MED ORDER — FLUTICASONE PROPIONATE 50 MCG/ACT NA SUSP: 2.0000 | Freq: Every day | NASAL | 6 refills | Status: DC

## 2016-09-24 MED ORDER — AMOXICILLIN 875 MG PO TABS: 875.0000 mg | ORAL_TABLET | Freq: Two times a day (BID) | ORAL | 0 refills | Status: DC

## 2016-09-24 MED ORDER — FLUOXETINE HCL 20 MG PO TABS: 40.0000 mg | ORAL_TABLET | Freq: Every day | ORAL | 1 refills | Status: DC

## 2016-09-24 NOTE — Assessment & Plan Note (Addendum)
Refilled meds given;   since LDL elevated in June 2018, will need recheck in 4-6 months and if still elevated, consider increase dose of meds

## 2016-09-24 NOTE — Patient Instructions (Addendum)
You can do Ayr or Milta Deiters med sinus rinse twice a day followed by 1 spray of Flonase each nostril  If you wanted to clean your ears, use half hydrogen peroxide and half rubbing alcohol and use a few drops each ear 2 times weekly   - We will increase your Prozac from 20 to 40 mg daily.  Once you are on it for a couple weeks, if you have a more stressful day or week you can always take 60 mg a day for 1 a week or so and then go back down to the 40.      If you developed vertiginous symptoms please see below-->     How to Treat Vertigo at Home with Exercises  What is Vertigo?  Vertigo is a relatively common symptom most often associated with conditions such as sinusitis (inflammation of your sinuses due to viruses, allergies, or bacterial infections), or an inner ear infection or ear trauma.   It can be brought on by trauma (e.g. a blow to the head or whiplash) or more serious things like minor strokes.   Symptoms can also be brought on by normal degenerative changes to your inner ear that occur with aging.  The condition tends to be more commonly seen in the elderly but it can occur in all ages.    Patients most often complain of dizziness, as if the room is spinning around them.   Symptoms are provoked by quick head movements or changes in position like going from standing to lying in bed, or even turning over in bed.   It may present with nausea and/or vomiting, and can be very debilitating to some folks.    By far the most common cause, known as Benign Paroxysmal Positional Vertigo (BPPV), is categorized by a sudden onset of symptoms, that are intense but short-lived (60 seconds or less), which is triggered by a change in head position.   Symptoms usually dissipate if you stay in one position and do not move your head.   Within the inner ear are collections of calcium carbonate crystals referred to as "otoliths" which may become dislodged from their normal position and migrate into the  semicircular canals of the inner ear, throwing off your body's ability to sense where you are in space.     Fig. 921 Anatomy of the Right Osseous Labyrinth. Antonieta Iba. Anatomy of the Human Body. 1918.            What Else Could Be Behind My Vertigo?  Some other causes of vertigo include:  Meniere's disease (disorder of inner ear with ringing in ears, feeling of fullness/pressure within ear, and fluctuating hearing loss) Tumours Neurological disorders e.g. Multiple Sclerosis Motion Sickness (lack of coordination between visual stimuli, inner ear balance and positional sense) Migraine Labyrinthitis (inflammation of the fluid-filled tubes and sacs within the inner ear; may also be associated with changes in hearing) Vestibular neuritis (inflammation of the nerves associated with transmission of sensory info from the inner ear; usually of viral origins)  How it can be treated/cured? While certain medications have been prescribed for vertigo including Lorazepam your doing well 7 house the house going organizing and getting things ready for sale with the and Meclizine (for motion sickness), there exists no evidence to support a recommendation of any medication in the routine treatment of BPPV.  Clinical trials have demonstrated that repositioning techniques (listed below) are a superior option for management Otis Dials et al., 2008).    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 For right ear BPPV, the procedure has to be performed in the opposite direction, starting with the head turned to the right side.  1. Start by sitting on a bed with your head turned 45 to the left. Place a pillow behind you so that on lying back it will be under your shoulders.  2. Lie back quickly with shoulders on the pillow, neck extended, and head resting on the bed. In this position, the affected (left) ear is  underneath. Wait for 30 secondS.  3. Turn your head 90 to the right (without raising it), and wait again for 30 seconds.  4. Turn your body and head another 90 to the right, and wait for another 30 seconds.  5. Sit up on the right side. This maneuver should be performed three times a day. Repeat this daily until you are free from positional vertigo for 24 hours.   (B) Instructions for the modified Semont maneuver (MSM) for left ear PC-BPPV. For right ear BPPV, the maneuver has to be performed in the opposite direction, starting with the head turned toward the left ear.  1. Sit upright on a bed with your head turned 45 toward the right ear.  2. Drop quickly to the left side, so that your head touches the bed behind your left ear. Wait 30 seconds.  3. Move head and trunk in a swift movement toward the other side without stopping in the upright position, so that your head comes to rest on the right side of your forehead. Wait again for 30 seconds.  4. Sit up again.  This maneuver should be performed three times a day. Repeat this daily until you are free from positional vertigo symptoms for 24 hours.   (   See the video in the supplementary material on the NeurologyWeb site; go to http://www.neurology.org/content/[email or web address]/150/[email or web address].expansion.html   )     You can also try this motion at home as well- Self-Treatment of Benign Paroxysmal Positional Vertigo Benign Paroxysmal Positioning Vertigo is caused by loose inner ear crystals in the inner ear that migrate while sleeping to the back-bottom inner ear balance canal, the so-called "posterior semi-circular canal." The maneuver demonstrated below is the way to reposition the loose crystals so that the symptoms caused by the loose crystals go away. You may have a floating, swaying sense while walking or sitting for a few days after this procedure.

## 2016-09-24 NOTE — Assessment & Plan Note (Addendum)
Refilled medications at current dose,   - labs done June 2018 were within normal limits.

## 2016-09-24 NOTE — Progress Notes (Signed)
Pt here for an acute care OV today   Impression and Recommendations:    1. OM (otitis media), acute, right   2. [place]   3. ETD (Eustachian tube dysfunction), unspecified laterality   4. Adjustment disorder with mixed anxiety and depressed mood   5. Mixed hyperlipidemia   6. Hypothyroidism    1. Amoxicillin given.  Supportive care discussed with patient.  For pain take over-the-counter Tylenol or Advil or Aleve.  If worse or no improvement after a couple days, let us know and we will consider steroids  2.  Use of burts bees blemish stick and/ or Clearasil topical anti-acne cream or the like.  Do not attempt to pinch or squeeze  3.  Sinus rinses twice a day followed by one spray Flonase each nostril.  Patient declines vertigo symptoms but was given a handout in case they develop he will have the home exercises to start right away  4.  Increase Prozac from 20 mg daily, to 40 mg daily.  Other supportive care for mood discussed with patient  5.  Refill of cholesterol medications given.  Will need recheck fasting lipid profile in approximately 4-6 months, as LDL was 174 in June 2018 and if no improvement, will need increase in dose.  6.  Refill of thyroid medicines given.  Recent labs June 2018 were within normal limits.  Continue current medication and dose   The patient was counseled, risk factors were discussed, anticipatory guidance given.  New Prescriptions   AMOXICILLIN (AMOXIL) 875 MG TABLET    Take 1 tablet (875 mg total) by mouth 2 (two) times daily.   FLUTICASONE (FLONASE) 50 MCG/ACT NASAL SPRAY    Place 2 sprays into both nostrils daily.    Discontinued Medications   No medications on file   Modified Medications   Modified Medication Previous Medication   ATORVASTATIN (LIPITOR) 20 MG TABLET atorvastatin (LIPITOR) 20 MG tablet      Take 1 tablet (20 mg total) by mouth daily.    TAKE 1 TABLET BY MOUTH DAILY.   FLUOXETINE (PROZAC) 20 MG TABLET FLUoxetine (PROZAC) 20  MG tablet      Take 2 tablets (40 mg total) by mouth daily. Caps are ok sub    Take 1 tablet (20 mg total) by mouth daily. Caps are ok sub   LEVOTHYROXINE (SYNTHROID, LEVOTHROID) 175 MCG TABLET levothyroxine (SYNTHROID, LEVOTHROID) 175 MCG tablet      Take 1 tablet (175 mcg total) by mouth daily before breakfast. Rotating with the 200 g every other day    TAKE 1 TABLET (175 MCG TOTAL) BY MOUTH DAILY BEFORE BREAKFAST AS DIRECTED.   LEVOTHYROXINE (SYNTHROID, LEVOTHROID) 200 MCG TABLET levothyroxine (SYNTHROID, LEVOTHROID) 200 MCG tablet      Take 1 tablet (200 mcg total) by mouth daily before breakfast. Rotating every other day with the 175    TAKE 1 TABLET BY MOUTH EVERY OTHER DAY ALTERNATING WITH LEVOTHYROXINE 175 MCG TABLET.    Orders Placed This Encounter  Procedures  . Lipid panel     Gross side effects, risk and benefits, and alternatives of medications and treatment plan in general discussed with patient.  Patient is aware that all medications have potential side effects and we are unable to predict every side effect or drug-drug interaction that may occur.   Patient will call with any questions prior to using medication if they have concerns.  Expresses verbal understanding and consents to current therapy and treatment regimen.  No  barriers to understanding were identified.  Red flag symptoms and signs discussed in detail.  Patient expressed understanding regarding what to do in case of emergency\urgent symptoms  Please see AVS handed out to patient at the end of our visit for further patient instructions/ counseling done pertaining to today's office visit.   Return if symptoms worsen or fail to improve, for F-up of current med issues as previously d/c pt.     Note: This document was prepared using Dragon voice recognition software and may include unintentional dictation errors.  Mellody Dance 5:25  PM --------------------------------------------------------------------------------------------------------------------------------------------------------------------------------------------------------------------------------------------    Subjective:    CC:  Chief Complaint  Patient presents with  . Ear Pain    bilat pain - right side worse, radiates into throat x 3 days     HPI: Andrew Blair is a 51 y.o. male who presents to [place] at [facility] today for issues as discussed below.  Patient has bilateral ear pain which started approximately 3 days ago right is worse than left.  He also feels a congestion and fullness in his sinuses and feels like the pain radiates from the right ear to his throat.  He has a little bit of postnasal drip and sore throat as well.  No fever chills, no headache otherwise, no cough, runny nose, shortness of breath or chest congestion.  Of note he also has a very sore right nostril at the opening laterally.  Not sure why.  Mood: Last office visit we started him on Prozac 20 mg.  He is tolerating it well.  He has no side effects.  He doesn't think that is made much difference one way or the other in his mood.  Patient agrees to increase dose today.    Also needs refill of his Synthroid and cholesterol medications.  Denies side effect of medications, tolerating them well.    Problem  Hld (Hyperlipidemia)  Hypothyroidism   hypothyroid      Wt Readings from Last 3 Encounters:  09/24/16 221 lb 12.8 oz (100.6 kg)  09/13/16 222 lb 8 oz (100.9 kg)  09/04/16 219 lb (99.3 kg)   BP Readings from Last 3 Encounters:  09/24/16 115/77  09/13/16 127/81  09/04/16 115/80   BMI Readings from Last 3 Encounters:  09/24/16 28.10 kg/m  09/13/16 28.19 kg/m  09/04/16 27.74 kg/m     Patient Care Team    Relationship Specialty Notifications Start End  Mellody Dance, DO PCP - General Family Medicine  08/09/15      Patient  Active Problem List   Diagnosis Date Noted  . Hypertriglyceridemia 10/08/2015    Priority: High  . HLD (hyperlipidemia) 08/29/2015    Priority: High  . Prediabetes 08/29/2015    Priority: High  . Hypothyroidism 08/29/2015    Priority: High  . Remote History of cocaine use- for about 10 yrs 09/04/2016    Priority: Medium  . Family history of coronary artery disease- Annamarie Major- AMI early 51's; Dad- stents in early 60's) 09/04/2016    Priority: Medium  . History of smoking 09/05/2015    Priority: Medium  . GERD (gastroesophageal reflux disease) 08/29/2015    Priority: Medium  . Irritable bowel syndrome with constipation 08/12/2015    Priority: Medium  . Acute gross stress reaction / anxiety 08/12/2015    Priority: Medium  . Family history of prostate cancer in father- 80 yo at onset 09/04/2016    Priority: Low  . Adrenal adenoma, left 09/04/2016  Priority: Low  . Fatigue 09/13/2015    Priority: Low  . Vitamin D deficiency 08/29/2015    Priority: Low  . Environmental and seasonal allergies 02/20/2011    Priority: Low  . Adjustment disorder with mixed anxiety and depressed mood 09/13/2016  . Colon cancer screening 09/04/2016  . Overweight (BMI 25.0-29.9) 09/04/2016  . Paronychia of finger of right hand 09/08/2015  . Cough 08/09/2015  . Abnormal CXR 08/09/2015  . ETD (eustachian tube dysfunction) 08/09/2015    Past Medical history, Surgical history, Family history, Social history, Allergies and Medications have been entered into the medical record, reviewed and changed as needed.    Current Meds  Medication Sig  . atorvastatin (LIPITOR) 20 MG tablet Take 1 tablet (20 mg total) by mouth daily.  . B Complex Vitamins (B COMPLEX PO) Take by mouth.  . cholecalciferol (VITAMIN D) 1000 units tablet Take 5,000 Units by mouth daily.  . Coenzyme Q10 (CO Q 10 PO) Take by mouth.  Marland Kitchen FLUoxetine (PROZAC) 20 MG tablet Take 2 tablets (40 mg total) by mouth daily. Caps are ok sub  .  ibuprofen (ADVIL,MOTRIN) 600 MG tablet Take 600 mg by mouth every 6 (six) hours as needed for moderate pain.  Marland Kitchen levothyroxine (SYNTHROID, LEVOTHROID) 175 MCG tablet Take 1 tablet (175 mcg total) by mouth daily before breakfast. Rotating with the 200 g every other day  . levothyroxine (SYNTHROID, LEVOTHROID) 200 MCG tablet Take 1 tablet (200 mcg total) by mouth daily before breakfast. Rotating every other day with the 175  . Omega-3 Fatty Acids (FISH OIL) 1000 MG CPDR Take 4 capsules by mouth daily.  . polyethylene glycol (MIRALAX / GLYCOLAX) packet Take 17 g by mouth daily as needed for mild constipation.  . TURMERIC PO Take by mouth.  . Vitamin D, Ergocalciferol, (DRISDOL) 50000 units CAPS capsule Take 1 capsule (50,000 Units total) by mouth every 7 (seven) days.  . [DISCONTINUED] atorvastatin (LIPITOR) 20 MG tablet TAKE 1 TABLET BY MOUTH DAILY.  . [DISCONTINUED] FLUoxetine (PROZAC) 20 MG tablet Take 1 tablet (20 mg total) by mouth daily. Caps are ok sub  . [DISCONTINUED] levothyroxine (SYNTHROID, LEVOTHROID) 175 MCG tablet TAKE 1 TABLET (175 MCG TOTAL) BY MOUTH DAILY BEFORE BREAKFAST AS DIRECTED.  . [DISCONTINUED] levothyroxine (SYNTHROID, LEVOTHROID) 200 MCG tablet TAKE 1 TABLET BY MOUTH EVERY OTHER DAY ALTERNATING WITH LEVOTHYROXINE 175 MCG TABLET.    Allergies:  No Known Allergies   Review of Systems: General:   Denies fever, chills, unexplained weight loss.  Optho/Auditory:   Denies visual changes, blurred vision/LOV Respiratory:   Denies wheeze, DOE more than baseline levels.  Cardiovascular:   Denies chest pain, palpitations, new onset peripheral edema  Gastrointestinal:   Denies nausea, vomiting, diarrhea, abd pain.  Genitourinary: Denies dysuria, freq/ urgency, flank pain or discharge from genitals.  Endocrine:     Denies hot or cold intolerance, polyuria, polydipsia. Musculoskeletal:   Denies unexplained myalgias, joint swelling, unexplained arthralgias, gait problems.  Skin:   Denies new onset rash, suspicious lesions Neurological:     Denies dizziness, unexplained weakness, numbness  Psychiatric/Behavioral:   Denies mood changes, suicidal or homicidal ideations, hallucinations    Objective:   Blood pressure 115/77, pulse 68, temperature 98.1 F (36.7 C), height 6' 2.5" (1.892 m), weight 221 lb 12.8 oz (100.6 kg). Body mass index is 28.1 kg/m. General:  Well Developed, well nourished, appropriate for stated age.  Neuro:  Alert and oriented,  extra-ocular muscles intact  HEENT:  Normocephalic, atraumatic, neck  supple, right TM - opaque- with yellowish-whitish effusion and buldge of TM, no LAD, L TM- WNL's, nares- Clr d/c, R nares- ext surface with tiny whitehead/pustule on intersection of ala nasi and lateral aspect R nares, no tenderness to palpation of sinuses.   Skin:  no gross rash, warm, pink. Cardiac:  RRR, S1 S2 Respiratory:  ECTA B/L and A/P, Not using accessory muscles, speaking in full sentences- unlabored. Vascular:  Ext warm, no cyanosis apprec.; cap RF less 2 sec. Psych:  No HI/SI, judgement and insight good, Euthymic mood. Full Affect.

## 2016-10-04 ENCOUNTER — Encounter: Payer: Self-pay | Admitting: Family Medicine

## 2016-10-29 ENCOUNTER — Ambulatory Visit: Payer: BLUE CROSS/BLUE SHIELD | Admitting: Family Medicine

## 2017-05-22 ENCOUNTER — Encounter: Payer: Self-pay | Admitting: Family Medicine

## 2017-05-22 ENCOUNTER — Ambulatory Visit (INDEPENDENT_AMBULATORY_CARE_PROVIDER_SITE_OTHER): Payer: 59 | Admitting: Family Medicine

## 2017-05-22 VITALS — BP 127/85 | HR 73 | Ht 74.5 in | Wt 223.0 lb

## 2017-05-22 DIAGNOSIS — E781 Pure hyperglyceridemia: Secondary | ICD-10-CM

## 2017-05-22 DIAGNOSIS — E079 Disorder of thyroid, unspecified: Secondary | ICD-10-CM | POA: Diagnosis not present

## 2017-05-22 DIAGNOSIS — R7989 Other specified abnormal findings of blood chemistry: Secondary | ICD-10-CM

## 2017-05-22 DIAGNOSIS — E559 Vitamin D deficiency, unspecified: Secondary | ICD-10-CM

## 2017-05-22 DIAGNOSIS — F430 Acute stress reaction: Secondary | ICD-10-CM | POA: Diagnosis not present

## 2017-05-22 DIAGNOSIS — Z8249 Family history of ischemic heart disease and other diseases of the circulatory system: Secondary | ICD-10-CM

## 2017-05-22 DIAGNOSIS — F39 Unspecified mood [affective] disorder: Secondary | ICD-10-CM

## 2017-05-22 DIAGNOSIS — F1010 Alcohol abuse, uncomplicated: Secondary | ICD-10-CM

## 2017-05-22 DIAGNOSIS — R7303 Prediabetes: Secondary | ICD-10-CM

## 2017-05-22 DIAGNOSIS — E782 Mixed hyperlipidemia: Secondary | ICD-10-CM

## 2017-05-22 DIAGNOSIS — R55 Syncope and collapse: Secondary | ICD-10-CM | POA: Insufficient documentation

## 2017-05-22 DIAGNOSIS — R5383 Other fatigue: Secondary | ICD-10-CM

## 2017-05-22 DIAGNOSIS — E663 Overweight: Secondary | ICD-10-CM

## 2017-05-22 DIAGNOSIS — Z130 Encounter for screening for diseases of the blood and blood-forming organs and certain disorders involving the immune mechanism: Secondary | ICD-10-CM
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
## 2017-05-22 NOTE — Patient Instructions (Signed)
Please realize, EXERCISE IS MEDICINE!  -  American Heart Association [facility]) guidelines for exercise : If you are in good health, without any medical conditions, you should engage in 150 minutes of moderate intensity aerobic activity per week.  This means you should be huffing and puffing throughout your workout.   Engaging in regular exercise will improve brain function and memory, as well as improve mood, boost immune system and help with weight management.  As well as the other, more well-known effects of exercise such as decreasing blood sugar levels, decreasing blood pressure,  and decreasing bad cholesterol levels/ increasing good cholesterol levels.     -  The AHA strongly endorses consumption of a diet that contains a variety of foods from all the food categories with an emphasis on fruits and vegetables; fat-free and low-fat dairy products; cereal and grain products; legumes and nuts; and fish, poultry, and/or extra lean meats.    Excessive food intake, especially of foods high in saturated and trans fats, sugar, and salt, should be avoided.    Adequate water intake of roughly 1/2 of your weight in pounds, should equal the ounces of water per day you should drink.  So for instance, if you're 200 pounds, that would be 100 ounces of water per day.         Mediterranean Diet  Why follow it? Research shows. . Those who follow the Mediterranean diet have a reduced risk of heart disease  . The diet is associated with a reduced incidence of Parkinson's and Alzheimer's diseases . People following the diet may have longer life expectancies and lower rates of chronic diseases  . The Dietary Guidelines for Americans recommends the Mediterranean diet as an eating plan to promote health and prevent disease  What Is the Mediterranean Diet?  . Healthy eating plan based on typical foods and recipes of Mediterranean-style cooking . The diet is primarily a plant based diet; these foods should make up a  majority of meals   Starches - Plant based foods should make up a majority of meals - They are an important sources of vitamins, minerals, energy, antioxidants, and fiber - Choose whole grains, foods high in fiber and minimally processed items  - Typical grain sources include wheat, oats, barley, corn, brown rice, bulgar, farro, millet, polenta, couscous  - Various types of beans include chickpeas, lentils, fava beans, black beans, white beans   Fruits  Veggies - Large quantities of antioxidant rich fruits & veggies; 6 or more servings  - Vegetables can be eaten raw or lightly drizzled with oil and cooked  - Vegetables common to the traditional Mediterranean Diet include: artichokes, arugula, beets, broccoli, brussel sprouts, cabbage, carrots, celery, collard greens, cucumbers, eggplant, kale, leeks, lemons, lettuce, mushrooms, okra, onions, peas, peppers, potatoes, pumpkin, radishes, rutabaga, shallots, spinach, sweet potatoes, turnips, zucchini - Fruits common to the Mediterranean Diet include: apples, apricots, avocados, cherries, clementines, dates, figs, grapefruits, grapes, melons, nectarines, oranges, peaches, pears, pomegranates, strawberries, tangerines  Fats - Replace butter and margarine with healthy oils, such as olive oil, canola oil, and tahini  - Limit nuts to no more than a handful a day  - Nuts include walnuts, almonds, pecans, pistachios, pine nuts  - Limit or avoid candied, honey roasted or heavily salted nuts - Olives are central to the Mediterranean diet - can be eaten whole or used in a variety of dishes   Meats Protein - Limiting red meat: no more than a few times a month -  When eating red meat: choose lean cuts and keep the portion to the size of deck of cards - Eggs: approx. 0 to 4 times a week  - Fish and lean poultry: at least 2 a week  - Healthy protein sources include, chicken, [place], lean beef, lamb - Increase intake of seafood such as tuna, salmon, trout,  mackerel, shrimp, scallops - Avoid or limit high fat processed meats such as sausage and bacon  Dairy - Include moderate amounts of low fat dairy products  - Focus on healthy dairy such as fat free yogurt, skim milk, low or reduced fat cheese - Limit dairy products higher in fat such as whole or 2% milk, cheese, ice cream  Alcohol - Moderate amounts of red wine is ok  - No more than 5 oz daily for women (all ages) and men older than age 72  - No more than 10 oz of wine daily for men younger than 12  Other - Limit sweets and other desserts  - Use herbs and spices instead of salt to flavor foods  - Herbs and spices common to the traditional Mediterranean Diet include: basil, bay leaves, chives, cloves, cumin, fennel, garlic, lavender, marjoram, mint, oregano, parsley, pepper, rosemary, sage, savory, sumac, tarragon, thyme   It's not just a diet, it's a lifestyle:  . The Mediterranean diet includes lifestyle factors typical of those in the region  . Foods, drinks and meals are best eaten with others and savored . Daily physical activity is important for overall good health . This could be strenuous exercise like running and aerobics . This could also be more leisurely activities such as walking, housework, yard-work, or taking the stairs . Moderation is the key; a balanced and healthy diet accommodates most foods and drinks . Consider portion sizes and frequency of consumption of certain foods   Meal Ideas & Options:  . Breakfast:  o Whole wheat toast or whole wheat English muffins with peanut butter & hard boiled egg o Steel cut oats topped with apples & cinnamon and skim milk  o Fresh fruit: banana, strawberries, melon, berries, peaches  o Smoothies: strawberries, bananas, greek yogurt, peanut butter o Low fat greek yogurt with blueberries and granola  o Egg white omelet with spinach and mushrooms o Breakfast couscous: whole wheat couscous, apricots, skim milk, cranberries  . Sandwiches:   o Hummus and grilled vegetables (peppers, zucchini, squash) on whole wheat bread   o Grilled chicken on whole wheat pita with lettuce, tomatoes, cucumbers or tzatziki  o Tuna salad on whole wheat bread: tuna salad made with greek yogurt, olives, red peppers, capers, green onions o Garlic rosemary lamb pita: lamb sauted with garlic, rosemary, salt & pepper; add lettuce, cucumber, greek yogurt to pita - flavor with lemon juice and black pepper  . Seafood:  o Mediterranean grilled salmon, seasoned with garlic, basil, parsley, lemon juice and black pepper o Shrimp, lemon, and spinach whole-grain pasta salad made with low fat greek yogurt  o Seared scallops with lemon orzo  o Seared tuna steaks seasoned salt, pepper, coriander topped with tomato mixture of olives, tomatoes, olive oil, minced garlic, parsley, green onions and cappers  . Meats:  o Herbed greek chicken salad with kalamata olives, cucumber, feta  o Red bell peppers stuffed with spinach, bulgur, lean ground beef (or lentils) & topped with feta   o Kebabs: skewers of chicken, tomatoes, onions, zucchini, squash  o [place] burgers: made with red onions, mint, dill, lemon juice, feta  cheese topped with roasted red peppers . Vegetarian o Cucumber salad: cucumbers, artichoke hearts, celery, red onion, feta cheese, tossed in olive oil & lemon juice  o Hummus and whole grain pita points with a greek salad (lettuce, tomato, feta, olives, cucumbers, red onion) o Lentil soup with celery, carrots made with vegetable broth, garlic, salt and pepper  o Tabouli salad: parsley, bulgur, mint, scallions, cucumbers, tomato, radishes, lemon juice, olive oil, salt and pepper.    The quick and dirty--> lower triglyceride levels more through...  1) - Beware of bad fats: Cutting back on saturated fat (in red meat and full-fat dairy foods) and trans fats (in restaurant fried foods and commercially prepared baked goods) can lower triglycerides.  2) - Go for  good carbs: Easily digested carbohydrates (such as white bread, white rice, cornflakes, and sugary sodas) give triglycerides a definite boost.   3) - Eating whole grains and cutting back on soda can help control triglycerides.  4) - Check your alcohol use. In some people, alcohol dramatically boosts triglycerides. The only way to know if this is true for you is to avoid alcohol for a few weeks and have your triglycerides tested again.  5) - Go fish. Omega-3 fats in salmon, tuna, sardines, and other fatty fish can lower triglycerides. Having fish twice a week is fine.  6) - Aim for a healthy weight. If you are overweight, losing just 5% to 10% of your weight can help drive down triglycerides.  7) - Get moving. Exercise lowers triglycerides and boosts heart-healthy HDL cholesterol.  8) - quit smoking if you do  --> for more information, see below; or go to  www.heart.org  and do a search for desired topics   For those diagnosed with high triglycerides, it's important to take action to lower your levels and improve your heart health.  Triglyceride is just a fancy word for fat - the fat in our bodies is stored in the form of triglycerides. Triglycerides are found in foods and manufactured in our bodies.  Normal triglyceride levels are defined as less than 150 mg/dL; 150 to 199 is considered borderline high; 200 to 499 is high; and 500 or higher is officially called very high. To me, anything over 150 is a red flag indicating my patient needs to take immediate steps to get the situation under control.   What is the significance of high triglycerides? High triglyceride levels make blood thicker and stickier, which means that it is more likely to form clots. Studies have shown that triglyceride levels are associated with increased risks of cardiovascular disease and stroke - in both men and women - alone or in combination with other risk factors (high triglycerides combined with high LDL cholesterol  can be a particularly deadly combination). For example, in one ground-breaking study, high triglycerides alone increased the risk of cardiovascular disease by 14 percent in men, and by 14 percent in women. But when the test subjects also had low HDL cholesterol (that's the good cholesterol) and other risk factors, high triglycerides increased the risk of disease by 32 percent in men and 76 percent in women.   Fortunately, triglycerides can sometimes be controlled with several diet and lifestyle changes.    What Factors Can Increase Triglycerides? As with cholesterol, eating too much of the wrong kinds of fats will raise your blood triglycerides.  Therefore, it's important to restrict the amounts of saturated fats and trans fats you allow into your diet.  Triglyceride levels can also  shoot up after eating foods that are high in carbohydrates or after drinking alcohol.  That's why triglyceride blood tests require an overnight fast.  If you have elevated triglycerides, it's especially important to avoid sugary and refined carbohydrates, including sugar, honey, and other sweeteners, soda and other sugary drinks, candy, baked goods, and anything made with white (refined or enriched) flour, including white bread, rolls, cereals, buns, pastries, regular pasta, and white rice.  You'll also want to limit dried fruit and fruit juice since they're dense in simple sugar.  All of these low-quality carbs cause a sudden rise in insulin, which may lead to a spike in triglycerides.  Triglycerides can also become elevated as a reaction to having diabetes, hypothyroidism, or kidney disease. As with most other heart-related factors, being overweight and inactive also contribute to abnormal triglycerides. And unfortunately, some people have a genetic predisposition that causes them to manufacture way too much triglycerides on their own, no matter how carefully they eat.     How Can You Lower Your Triglyceride Levels? If you  are diagnosed with high triglycerides, it's important to take action. There are several things you can do to help lower your triglyceride levels and improve your heart health:  --> Lose weight if you are overweight.  There is a clear correlation between obesity and high triglycerides - the heavier people are, the higher their triglyceride levels are likely to be. The good news is that losing weight can significantly lower triglycerides. In a large study of individuals with type 2 diabetes, those assigned to the "lifestyle intervention group" - which involved counseling, a low-calorie meal plan, and customized exercise program - lost 8.6% of their body weight and lowered their triglyceride levels by more than 16%. If you're overweight, find a weight loss plan that works for you and commit to shedding the pounds and getting healthier.  --> Reduce the amount of saturated fat and trans fat in your diet.  Start by avoiding or dramatically limiting butter, cream cheese, lard, sour cream, doughnuts, cakes, cookies, candy bars, regular ice cream, fried foods, pizza, cheese sauce, cream-based sauces and salad dressings, high-fat meats (including fatty hamburgers, bologna, pepperoni, sausage, bacon, salami, pastrami, spareribs, and hot dogs), high-fat cuts of beef and pork, and whole-milk dairy products.   Other ways to cut back: Choose lean meats only (including skinless chicken and [place], lean beef, lean pork), fish, and reduced-fat or fat-free dairy products.   Experiment with adding whole soy foods to your diet. Although soy itself may not reduce risk of heart disease, it replaces hazardous animal fats with healthier proteins. Choose high-quality soy foods, such as tofu, tempeh, soy milk, and edamame (whole soybeans).  Always remove skin from poultry.  Prepare foods by baking, roasting, broiling, boiling, poaching, steaming, grilling, or stir-frying in vegetable oil.  Most stick margarines contain trans  fats, and trans fats are also found in some packaged baked goods, potato chips, snack foods, fried foods, and fast food that use or create hydrogenated oils.    (All food labels must now list the amount of trans fats, right after the amount of saturated fats - good news for consumers. As a result, many food companies have now reformulated their products to be trans fat free.many, but not all! So it's still just as important to read labels and make sure the packaged foods you buy don't contain trans fats.)     If you use margarine, purchase soft-tub margarine spreads that contain 0 grams trans fats  and don't list any partially hydrogenated oils in the ingredients list. By substituting olive oil or vegetable oil for trans fats in just 2 percent of your daily calories, you can reduce your risk of heart disease by 53 percent.   There is no safe amount of trans fats, so try to keep them as far from your plate as possible.  -->  Avoid foods that are concentrated in sugar (even dried fruit and fruit juice). Sugary foods can elevate triglyceride levels in the blood, so keep them to a bare minimum.  --> Swap out refined carbohydrates for whole grains.  Refined carbohydrates - like white rice, regular pasta, and anything made with white or "enriched" flour (including white bread, rolls, cereals, buns, and crackers) - raise blood sugar and insulin levels more than fiber-rich whole grains. Higher insulin levels, in turn, can lead to a higher rise in triglycerides after a meal. So, make the switch to whole wheat bread, whole grain pasta, brown or wild rice, and whole grain versions of cereals, crackers, and other bread products. However, it's important to know that individuals with high triglycerides should moderate even their intake of high-quality starches (since all starches raise blood sugar) - I recommend 1 to 2 servings per meal.  --> Cut way back on alcohol.  If you have high triglycerides, alcohol should be  considered a rare treat - if you indulge at all, since even small amounts of alcohol can dramatically increase triglyceride levels.  --> Incorporate omega-3 fats.  Heart-healthy fish oils are especially rich in omega-3 fatty acids. In multiple studies over the past two decades, people who ate diets high in omega-3s had 30 to 40 percent reductions in heart disease. Although we don't yet know why fish oil works so well, there are several possibilities. Omega-3s seem to reduce inflammation, reduce high blood pressure, decrease triglycerides, raise HDL cholesterol, and make blood thinner and less sticky so it is less likely to clot. It's as close to a food prescription for heart health as it gets. If you have high triglycerides, I recommend eating at least three servings of one of the omega-3-rich fish every week (fatty fish is the most concentrated food form of omega three fats). If you cannot manage to eat that much fish, speak with your physician about taking fish oil capsules, which offer similar benefits.The best foods for omega-3 fatty acids include wild salmon (fresh, canned), herring, mackerel (not king), sardines, anchovies, rainbow trout, and Pacific oysters. Non-fish sources of omega-3 fats include omega-3-fortified eggs, ground flaxseed, chia seeds, walnuts, butternuts (white walnuts), seaweed, walnut oil, canola oil, and soybeans.  --> Quit smoking.  Smoking causes inflammation, not just in your lungs, but throughout your body. Inflammation can contribute to atherosclerosis, blood clots, and risk of heart attack. Smoking makes all heart health indicators worse. If you have high cholesterol, high triglycerides, or high blood pressure, smoking magnifies the danger.  --> Become more physically active.  Even moderate exercise can help improve cholesterol, triglycerides, and blood pressure. Aerobic exercise seems to be able to stop the sharp rise of triglycerides after eating, perhaps because of a  decrease in the amount of triglyceride released by the liver, or because active muscle clears triglycerides out of the blood stream more quickly than inactive muscle. If you haven't exercised regularly (or at all) for years, I recommend starting slowly, by walking at an easy pace for 15 minutes a day. Then, as you feel more comfortable, increase the amount. Your ultimate goal  should be at least 30 minutes of moderate physical activity, at least five days a week.

## 2017-05-27 ENCOUNTER — Other Ambulatory Visit (INDEPENDENT_AMBULATORY_CARE_PROVIDER_SITE_OTHER): Payer: 59

## 2017-05-27 DIAGNOSIS — E782 Mixed hyperlipidemia: Secondary | ICD-10-CM | POA: Diagnosis not present

## 2017-05-27 DIAGNOSIS — E781 Pure hyperglyceridemia: Secondary | ICD-10-CM | POA: Diagnosis not present

## 2017-05-27 DIAGNOSIS — R7303 Prediabetes: Secondary | ICD-10-CM | POA: Diagnosis not present

## 2017-05-27 DIAGNOSIS — E079 Disorder of thyroid, unspecified: Secondary | ICD-10-CM | POA: Diagnosis not present

## 2017-05-27 DIAGNOSIS — R7989 Other specified abnormal findings of blood chemistry: Secondary | ICD-10-CM | POA: Diagnosis not present

## 2017-05-28 LAB — CBC WITH DIFFERENTIAL/PLATELET
Basophils Absolute: 0 10*3/uL (ref 0.0–0.2)
Basos: 1 %
EOS (ABSOLUTE): 0.1 10*3/uL (ref 0.0–0.4)
Eos: 3 %
Hematocrit: 43.1 % (ref 37.5–51.0)
Hemoglobin: 14.4 g/dL (ref 13.0–17.7)
IMMATURE GRANULOCYTES: 0 %
Immature Grans (Abs): 0 10*3/uL (ref 0.0–0.1)
Lymphocytes Absolute: 1.5 10*3/uL (ref 0.7–3.1)
Lymphs: 31 %
MCH: 30.3 pg (ref 26.6–33.0)
MCHC: 33.4 g/dL (ref 31.5–35.7)
MCV: 91 fL (ref 79–97)
MONOCYTES: 6 %
MONOS ABS: 0.3 10*3/uL (ref 0.1–0.9)
NEUTROS PCT: 59 %
Neutrophils Absolute: 2.8 10*3/uL (ref 1.4–7.0)
Platelets: 255 10*3/uL (ref 150–379)
RBC: 4.76 x10E6/uL (ref 4.14–5.80)
RDW: 13.9 % (ref 12.3–15.4)
WBC: 4.7 10*3/uL (ref 3.4–10.8)

## 2017-05-28 LAB — COMPREHENSIVE METABOLIC PANEL
ALT: 34 IU/L (ref 0–44)
AST: 24 IU/L (ref 0–40)
Albumin/Globulin Ratio: 1.8 (ref 1.2–2.2)
Albumin: 4.6 g/dL (ref 3.5–5.5)
Alkaline Phosphatase: 55 IU/L (ref 39–117)
BUN/Creatinine Ratio: 15 (ref 9–20)
BUN: 16 mg/dL (ref 6–24)
Bilirubin Total: 0.5 mg/dL (ref 0.0–1.2)
CALCIUM: 9.7 mg/dL (ref 8.7–10.2)
CO2: 23 mmol/L (ref 20–29)
CREATININE: 1.07 mg/dL (ref 0.76–1.27)
Chloride: 102 mmol/L (ref 96–106)
GFR calc Af Amer: 92 mL/min/{1.73_m2} (ref >59)
GFR, EST NON AFRICAN AMERICAN: 80 mL/min/{1.73_m2} (ref >59)
GLOBULIN, TOTAL: 2.5 g/dL (ref 1.5–4.5)
GLUCOSE: 98 mg/dL (ref 65–99)
Potassium: 4.9 mmol/L (ref 3.5–5.2)
Sodium: 143 mmol/L (ref 134–144)
Total Protein: 7.1 g/dL (ref 6.0–8.5)

## 2017-05-28 LAB — T3, FREE: T3, Free: 2.9 pg/mL (ref 2.0–4.4)

## 2017-05-28 LAB — LIPID PANEL
CHOLESTEROL TOTAL: 214 mg/dL — AB (ref 100–199)
Chol/HDL Ratio: 3.4 ratio (ref 0.0–5.0)
HDL: 63 mg/dL (ref >39)
LDL Calculated: 123 mg/dL — ABNORMAL HIGH (ref 0–99)
Triglycerides: 140 mg/dL (ref 0–149)
VLDL Cholesterol Cal: 28 mg/dL (ref 5–40)

## 2017-05-28 LAB — TSH: TSH: 6.19 u[IU]/mL — ABNORMAL HIGH (ref 0.450–4.500)

## 2017-05-28 LAB — HEMOGLOBIN A1C
Est. average glucose Bld gHb Est-mCnc: 114 mg/dL
Hgb A1c MFr Bld: 5.6 % (ref 4.8–5.6)

## 2017-05-28 LAB — PHOSPHORUS: PHOSPHORUS: 4.2 mg/dL (ref 2.5–4.5)

## 2017-05-28 LAB — MAGNESIUM: MAGNESIUM: 1.9 mg/dL (ref 1.6–2.3)

## 2017-05-28 LAB — RPR: RPR Ser Ql: NONREACTIVE

## 2017-05-28 LAB — LACTATE DEHYDROGENASE: LDH: 186 IU/L (ref 121–224)

## 2017-05-28 LAB — VITAMIN D 25 HYDROXY (VIT D DEFICIENCY, FRACTURES): VIT D 25 HYDROXY: 44.2 ng/mL (ref 30.0–100.0)

## 2017-05-28 LAB — FOLATE: Folate: 10.6 ng/mL (ref >3.0)

## 2017-05-28 LAB — VITAMIN B12: Vitamin B-12: 931 pg/mL (ref 232–1245)

## 2017-05-28 LAB — HIV ANTIBODY (ROUTINE TESTING W REFLEX): HIV SCREEN 4TH GENERATION: NONREACTIVE

## 2017-05-28 LAB — HEPATITIS C ANTIBODY

## 2017-05-28 LAB — TESTOSTERONE: TESTOSTERONE: 273 ng/dL (ref 264–916)

## 2017-05-28 LAB — GAMMA GT: GGT: 65 IU/L (ref 0–65)

## 2017-05-28 LAB — T4, FREE: Free T4: 1.28 ng/dL (ref 0.82–1.77)

## 2017-05-28 LAB — SEDIMENTATION RATE: SED RATE: 10 mm/h (ref 0–30)

## 2017-05-29 ENCOUNTER — Encounter: Payer: Self-pay | Admitting: Family Medicine

## 2017-08-14 IMAGING — DX DG CHEST 2V
2 series · 3 of 3 positions shown · non-contrast
Comparison: 03/05/2016 and chest CT, 03/09/2016

CLINICAL DATA: Followup pneumonia.

EXAM:
CHEST  2 VIEW

[Series 1: chest pa · 0.14mm/px · 2 of 2 slices shown]
[im 1/2]
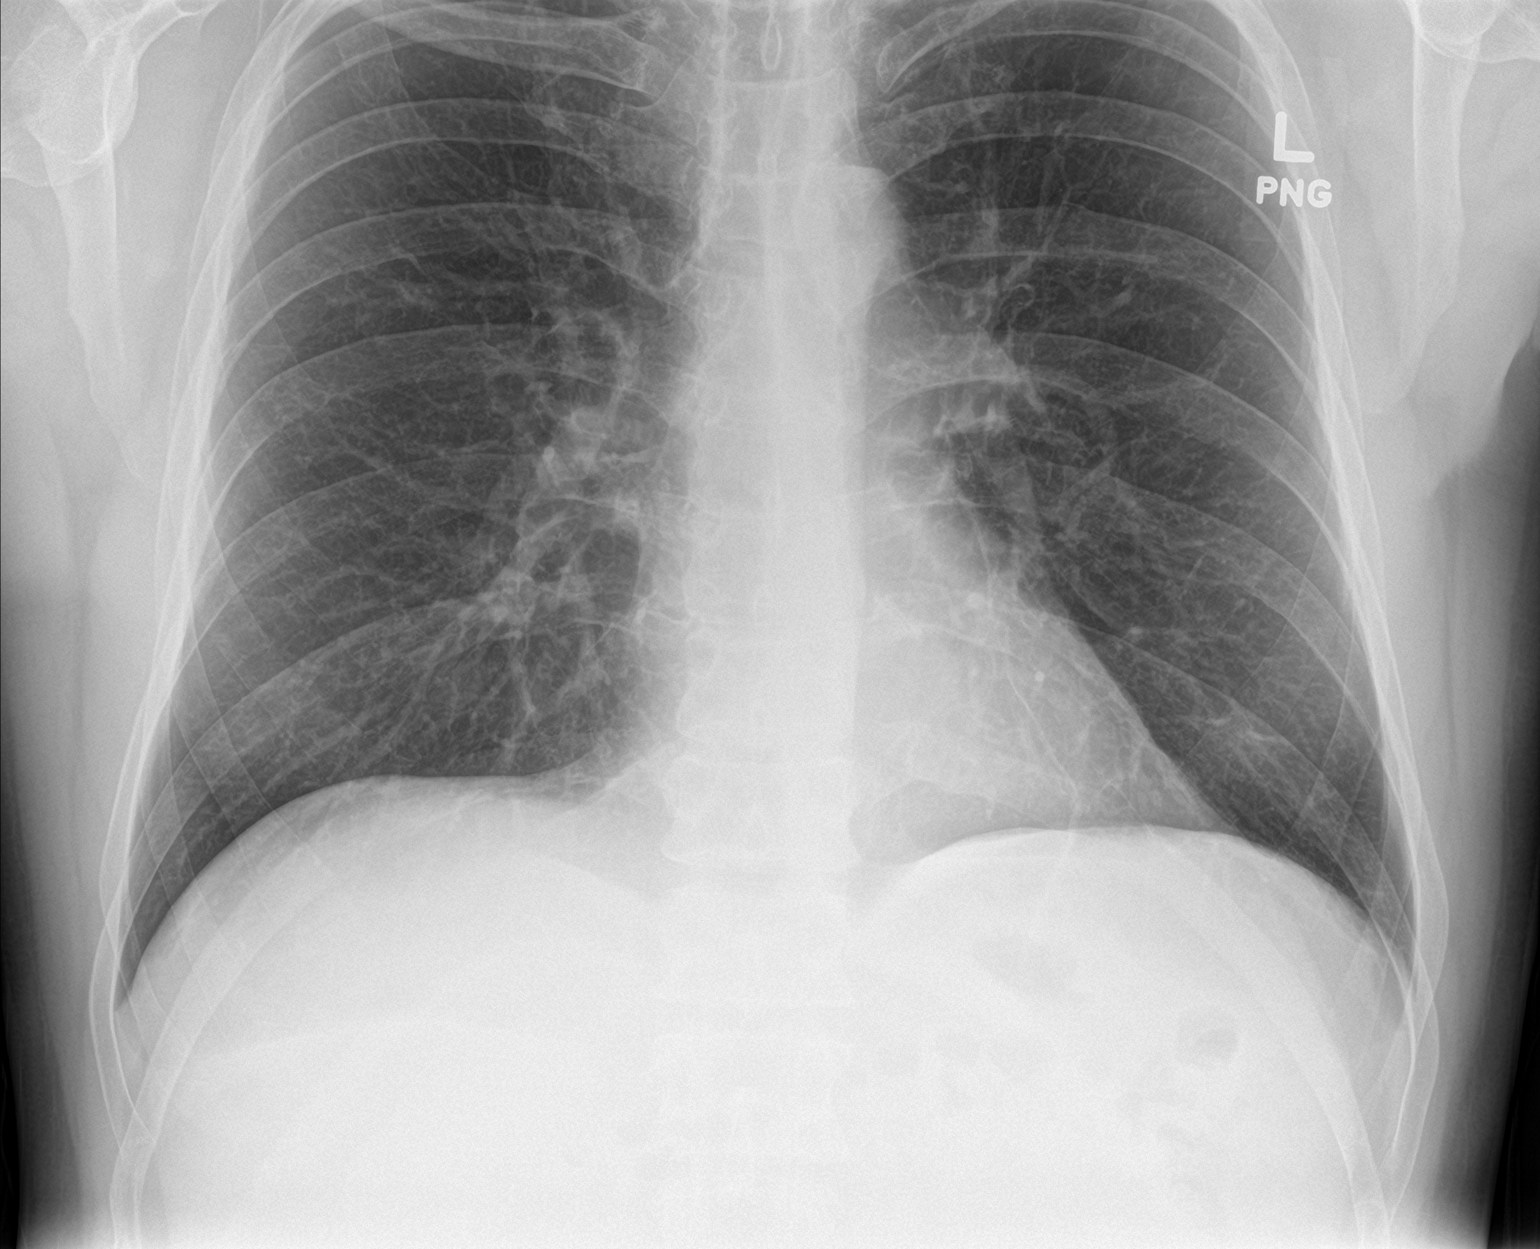
[im 2/2]
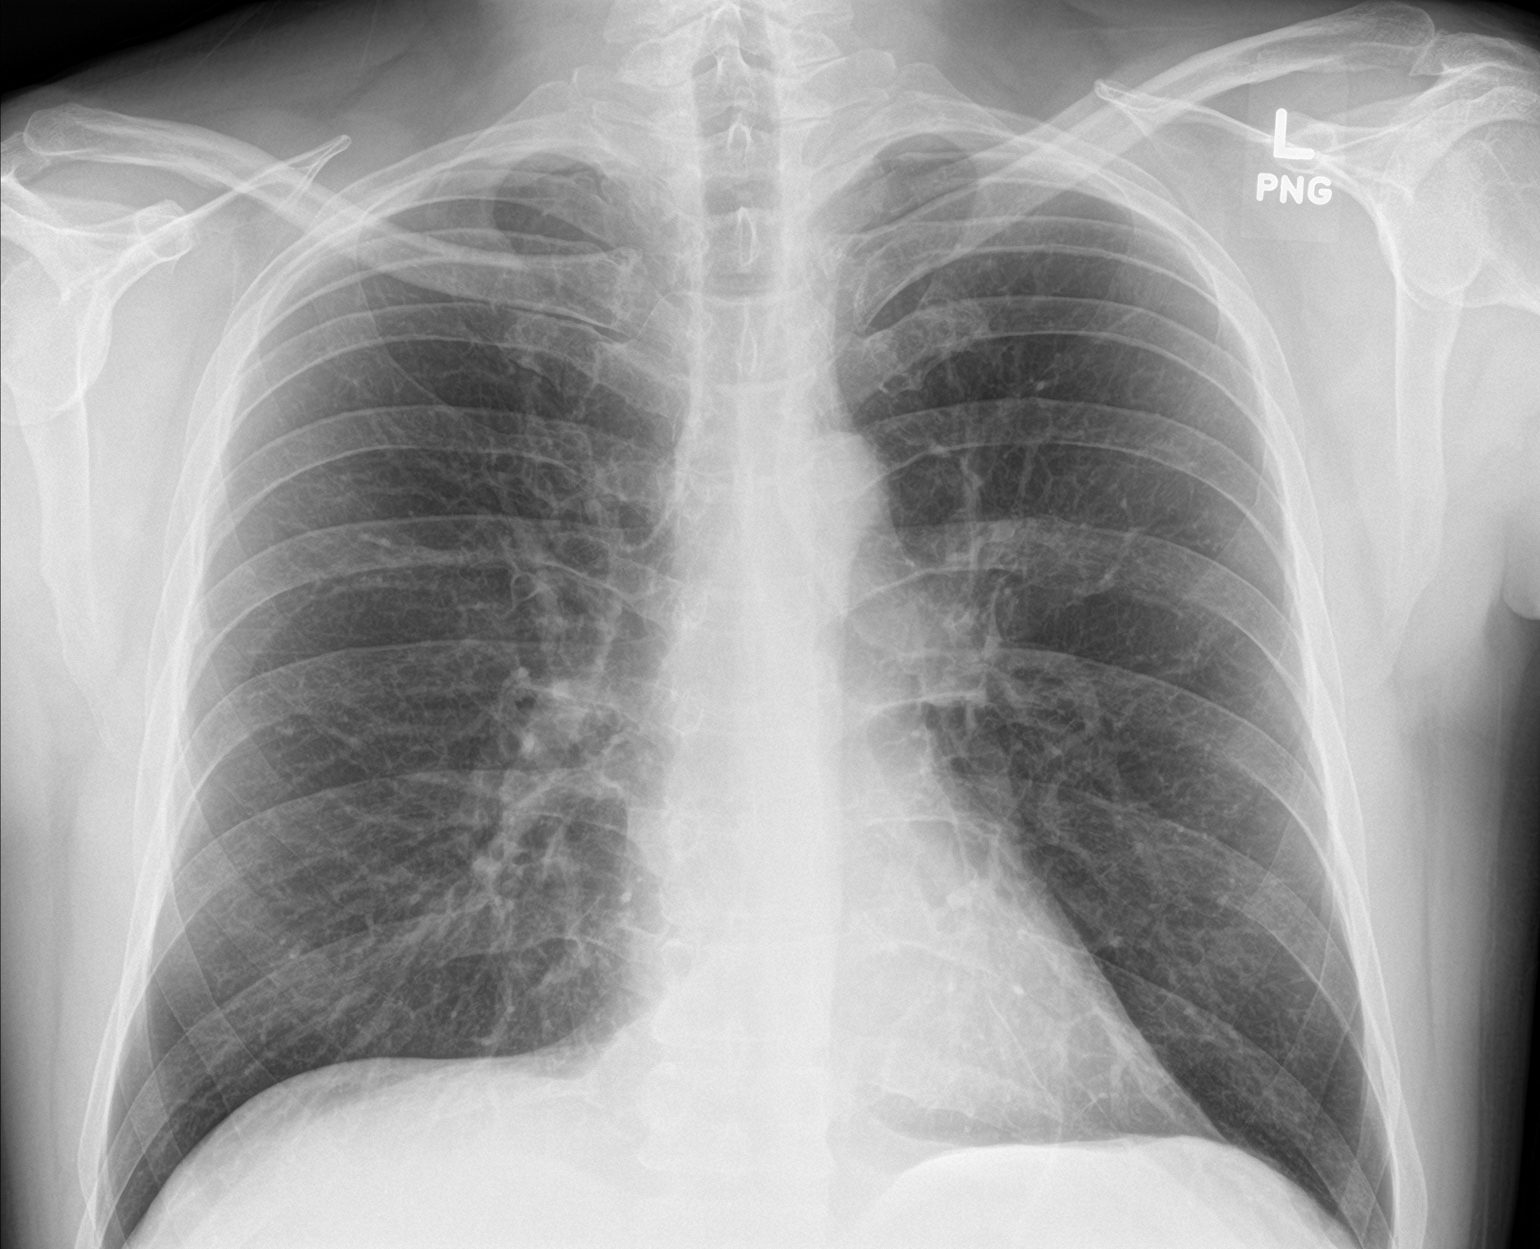

[chest lat]
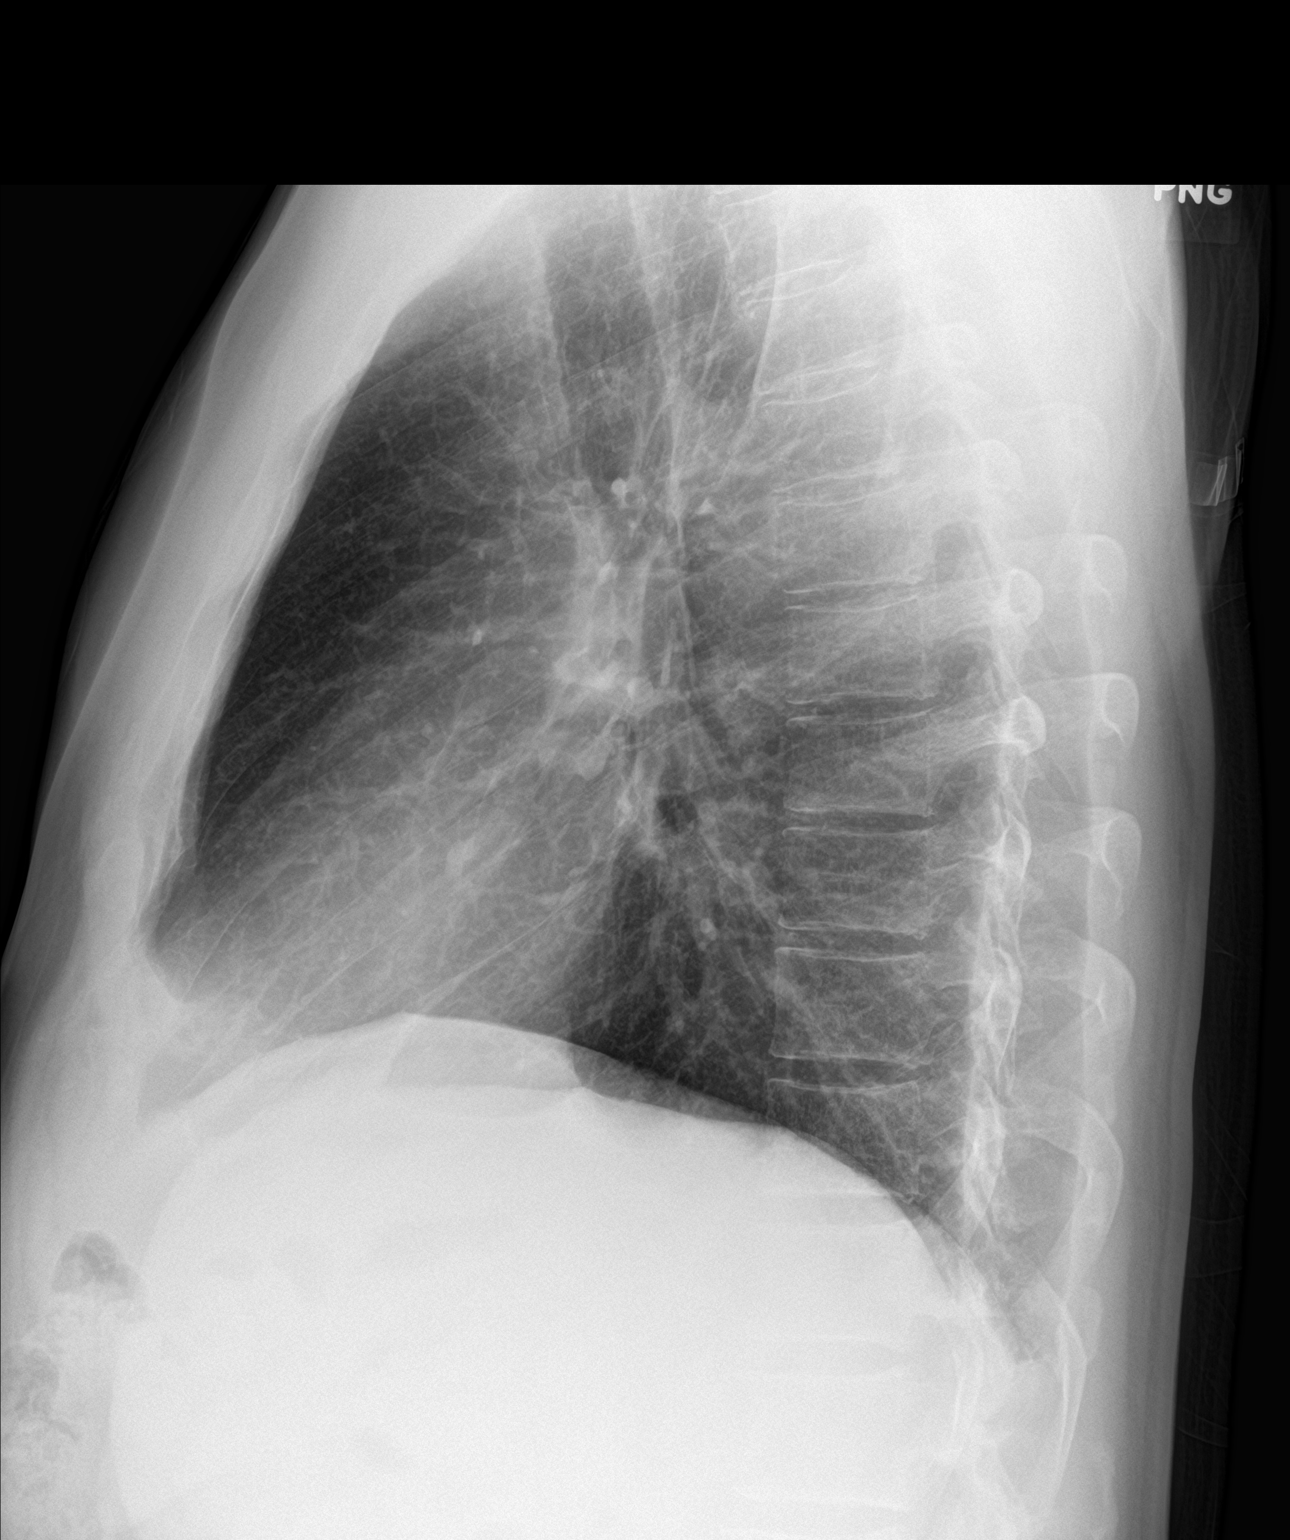

[3 of 3 positions shown; findings below may reference images not displayed]

FINDINGS: The opacity and knee left upper lobe and superior segmental left
lower lobe has resolved. Lungs are now clear.

No pleural effusion or pneumothorax.

Normal heart, mediastinum and hila.

Skeletal structures are unremarkable.
IMPRESSION: 1. Resolved left upper and lower lobe pneumonia.
2. No active cardiopulmonary disease.

## 2017-12-09 ENCOUNTER — Other Ambulatory Visit: Payer: Self-pay | Admitting: Family Medicine

## 2017-12-11 NOTE — Telephone Encounter (Signed)
Only give 62mo suppply- needs OV with FLP, TSH, F T4, CMP 5 d prior to OV w me

## 2017-12-11 NOTE — Telephone Encounter (Signed)
Please call pt to schedule follow up OV.  No further refills until pt is seen in office.  Charyl Bigger, CMA

## 2018-01-16 ENCOUNTER — Ambulatory Visit (INDEPENDENT_AMBULATORY_CARE_PROVIDER_SITE_OTHER): Payer: 59 | Admitting: Family Medicine

## 2018-01-16 ENCOUNTER — Encounter: Payer: Self-pay | Admitting: Family Medicine

## 2018-01-16 VITALS — BP 114/79 | HR 71 | Ht 75.0 in | Wt 227.0 lb

## 2018-01-16 DIAGNOSIS — R7303 Prediabetes: Secondary | ICD-10-CM | POA: Diagnosis not present

## 2018-01-16 DIAGNOSIS — E079 Disorder of thyroid, unspecified: Secondary | ICD-10-CM | POA: Diagnosis not present

## 2018-01-16 DIAGNOSIS — Z1211 Encounter for screening for malignant neoplasm of colon: Secondary | ICD-10-CM

## 2018-01-16 DIAGNOSIS — F39 Unspecified mood [affective] disorder: Secondary | ICD-10-CM

## 2018-01-16 DIAGNOSIS — E781 Pure hyperglyceridemia: Secondary | ICD-10-CM

## 2018-01-16 DIAGNOSIS — E782 Mixed hyperlipidemia: Secondary | ICD-10-CM

## 2018-01-16 MED ORDER — LEVOTHYROXINE SODIUM 175 MCG PO TABS: 175.0000 ug | ORAL_TABLET | ORAL | 1 refills | Status: DC

## 2018-01-16 MED ORDER — LEVOTHYROXINE SODIUM 200 MCG PO TABS: 200.0000 ug | ORAL_TABLET | Freq: Every day | ORAL | 0 refills | Status: DC

## 2018-01-16 MED ORDER — ATORVASTATIN CALCIUM 20 MG PO TABS: 20.0000 mg | ORAL_TABLET | Freq: Every day | ORAL | 0 refills | Status: DC

## 2018-01-16 MED ORDER — LEVOTHYROXINE SODIUM 200 MCG PO TABS: 200.0000 ug | ORAL_TABLET | ORAL | 1 refills | Status: DC

## 2018-01-16 MED ORDER — ATORVASTATIN CALCIUM 20 MG PO TABS: 20.0000 mg | ORAL_TABLET | Freq: Every day | ORAL | 1 refills | Status: DC

## 2018-01-16 MED ORDER — LEVOTHYROXINE SODIUM 175 MCG PO TABS: 175.0000 ug | ORAL_TABLET | Freq: Every day | ORAL | 0 refills | Status: DC

## 2018-01-16 NOTE — Patient Instructions (Addendum)
Please keep at eye on weight at home. If you develop GI symptoms in right upper quadrant of abdomen, despite weight being under 225, please let us know, as you may need further workup.   Risk factors for prediabetes and type 2 diabetes  Researchers don't fully understand why some people develop prediabetes and type 2 diabetes and others don't.  It's clear that certain factors increase the risk, however, including:  Weight. The more fatty tissue you have, the more resistant your cells become to insulin.  Inactivity. The less active you are, the greater your risk. Physical activity helps you control your weight, uses up glucose as energy and makes your cells more sensitive to insulin.  Family history. Your risk increases if a parent or sibling has type 2 diabetes.  Race. Although it's unclear why, people of certain races -- including blacks, Hispanics, American Indians and Asian-Americans -- are at higher risk.  Age. Your risk increases as you get older. This may be because you tend to exercise less, lose muscle mass and gain weight as you age. But type 2 diabetes is also increasing dramatically among children, adolescents and younger adults.  Gestational diabetes. If you developed gestational diabetes when you were pregnant, your risk of developing prediabetes and type 2 diabetes later increases. If you gave birth to a baby weighing more than 9 pounds (4 kilograms), you're also at risk of type 2 diabetes.  Polycystic ovary syndrome. For women, having polycystic ovary syndrome -- a common condition characterized by irregular menstrual periods, excess hair growth and obesity -- increases the risk of diabetes.  High blood pressure. Having blood pressure over 140/90 millimeters of mercury (mm Hg) is linked to an increased risk of type 2 diabetes.  Abnormal cholesterol and triglyceride levels. If you have low levels of high-density lipoprotein (HDL), or "good," cholesterol, your risk of type 2 diabetes is  higher. Triglycerides are another type of fat carried in the blood. People with high levels of triglycerides have an increased risk of type 2 diabetes. Your doctor can let you know what your cholesterol and triglyceride levels are.  A good guide to good carbs: The glycemic index ---If you have diabetes, or at risk for diabetes, you know all too well that when you eat carbohydrates, your blood sugar goes up. The total amount of carbs you consume at a meal or in a snack mostly determines what your blood sugar will do. But the food itself also plays a role. A serving of white rice has almost the same effect as eating pure table sugar -- a quick, high spike in blood sugar. A serving of lentils has a slower, smaller effect.  ---Picking good sources of carbs can help you control your blood sugar and your weight. Even if you don't have diabetes, eating healthier carbohydrate-rich foods can help ward off a host of chronic conditions, from heart disease to various cancers to, well, diabetes.  ---One way to choose foods is with the glycemic index (GI). This tool measures how much a food boosts blood sugar.  The glycemic index rates the effect of a specific amount of a food on blood sugar compared with the same amount of pure glucose. A food with a glycemic index of 28 boosts blood sugar only 28% as much as pure glucose. One with a GI of 95 acts like pure glucose.    High glycemic foods result in a quick spike in insulin and blood sugar (also known as blood glucose).  Low glycemic  foods have a slower, smaller effect- these are healthier for you.   Using the glycemic index Using the glycemic index is easy: choose foods in the low GI category instead of those in the high GI category (see below), and go easy on those in between. Low glycemic index (GI of 55 or less): Most fruits and vegetables, beans, minimally processed grains, pasta, low-fat dairy foods, and nuts.  Moderate glycemic index (GI 56 to 69): White  and sweet potatoes, corn, white rice, couscous, breakfast cereals such as Cream of Wheat and Mini Wheats.  High glycemic index (GI of 70 or higher): White bread, rice cakes, most crackers, bagels, cakes, doughnuts, croissants, most packaged breakfast cereals. You can see the values for 100 commons foods and get links to more at www.[email or web address].si.  Swaps for lowering glycemic index  Instead of this high-glycemic index food Eat this lower-glycemic index food  White rice Brown rice or converted rice  Instant oatmeal Steel-cut oats  Cornflakes Bran flakes  Baked potato Pasta, bulgur  White bread Whole-grain bread  Corn Peas or leafy greens       Prediabetes Eating Plan  Prediabetes--also called impaired glucose tolerance or impaired fasting glucose--is a condition that causes blood sugar (blood glucose) levels to be higher than normal. Following a healthy diet can help to keep prediabetes under control. It can also help to lower the risk of type 2 diabetes and heart disease, which are increased in people who have prediabetes. Along with regular exercise, a healthy diet:  Promotes weight loss.  Helps to control blood sugar levels.  Helps to improve the way that the body uses insulin.   WHAT DO I NEED TO KNOW ABOUT THIS EATING PLAN?   Use the glycemic index (GI) to plan your meals. The index tells you how quickly a food will raise your blood sugar. Choose low-GI foods. These foods take a longer time to raise blood sugar.  Pay close attention to the amount of carbohydrates in the food that you eat. Carbohydrates increase blood sugar levels.  Keep track of how many calories you take in. Eating the right amount of calories will help you to achieve a healthy weight. Losing about 7 percent of your starting weight can help to prevent type 2 diabetes.  You may want to follow a Mediterranean diet. This diet includes a lot of vegetables, lean meats or fish, whole grains, fruits,  and healthy oils and fats.   WHAT FOODS CAN I EAT?  Grains Whole grains, such as whole-wheat or whole-grain breads, crackers, cereals, and pasta. Unsweetened oatmeal. Bulgur. Barley. Quinoa. Brown rice. Corn or whole-wheat flour tortillas or taco shells. Vegetables Lettuce. Spinach. Peas. Beets. Cauliflower. Cabbage. Broccoli. Carrots. Tomatoes. Squash. Eggplant. Herbs. Peppers. Onions. Cucumbers. Brussels sprouts. Fruits Berries. Bananas. Apples. Oranges. Grapes. Papaya. Mango. Pomegranate. Kiwi. Grapefruit. Cherries. Meats and Other Protein Sources Seafood. Lean meats, such as chicken and [place] or lean cuts of pork and beef. Tofu. Eggs. Nuts. Beans. Dairy Low-fat or fat-free dairy products, such as yogurt, cottage cheese, and cheese. Beverages Water. Tea. Coffee. Sugar-free or diet soda. Seltzer water. Milk. Milk alternatives, such as soy or almond milk. Condiments Mustard. Relish. Low-fat, low-sugar ketchup. Low-fat, low-sugar barbecue sauce. Low-fat or fat-free mayonnaise. Sweets and Desserts Sugar-free or low-fat pudding. Sugar-free or low-fat ice cream and other frozen treats. Fats and Oils Avocado. Walnuts. Olive oil. The items listed above may not be a complete list of recommended foods or beverages. Contact your dietitian for more options.  WHAT FOODS ARE NOT RECOMMENDED?  Grains Refined white flour and flour products, such as bread, pasta, snack foods, and cereals. Beverages Sweetened drinks, such as sweet iced tea and soda. Sweets and Desserts Baked goods, such as cake, cupcakes, pastries, cookies, and cheesecake. The items listed above may not be a complete list of foods and beverages to avoid. Contact your dietitian for more information.   This information is not intended to replace advice given to you by your health care provider. Make sure you discuss any questions you have with your health care provider.   Document Released: 07/20/2014 Document Reviewed:  07/20/2014 Elsevier Interactive Patient Education 2016 [place] for a Low Cholesterol, Low Saturated Fat Diet   Fats - Limit total intake of fats and oils. - Avoid butter, stick margarine, shortening, lard, palm and coconut oils. - Limit mayonnaise, salad dressings, gravies and sauces, unless they are homemade with low-fat ingredients. - Limit chocolate. - Choose low-fat and nonfat products, such as low-fat mayonnaise, low-fat or non-hydrogenated peanut butter, low-fat or fat-free salad dressings and nonfat gravy. - Use vegetable oil, such as canola or olive oil. - Look for margarine that does not contain trans fatty acids. - Use nuts in moderate amounts. - Read ingredient labels carefully to determine both amount and type of fat present in foods. Limit saturated and trans fats! - Avoid high-fat processed and convenience foods.  Meats and Meat Alternatives - Choose fish, chicken, [place] and lean meats. - Use dried beans, peas, lentils and tofu. - Limit egg yolks to three to four per week. - If you eat red meat, limit to no more than three servings per week and choose loin or round cuts. - Avoid fatty meats, such as bacon, sausage, franks, luncheon meats and ribs. - Avoid all organ meats, including liver.  Dairy - Choose nonfat or low-fat milk, yogurt and cottage cheese. - Most cheeses are high in fat. Choose cheeses made from non-fat milk, such as mozzarella and ricotta cheese. - Choose light or fat-free cream cheese and sour cream. - Avoid cream and sauces made with cream.  Fruits and Vegetables - Eat a wide variety of fruits and vegetables. - Use lemon juice, vinegar or "mist" olive oil on vegetables. - Avoid adding sauces, fat or oil to vegetables.  Breads, Cereals and Grains - Choose whole-grain breads, cereals, pastas and rice. - Avoid high-fat snack foods, such as granola, cookies, pies, pastries, doughnuts and croissants.  Cooking Tips - Avoid deep  fried foods. - Trim visible fat off meats and remove skin from poultry before cooking. - Bake, broil, boil, poach or roast poultry, fish and lean meats. - Drain and discard fat that drains out of meat as you cook it. - Add little or no fat to foods. - Use vegetable oil sprays to grease pans for cooking or baking. - Steam vegetables. - Use herbs or no-oil marinades to flavor foods.

## 2018-01-16 NOTE — Progress Notes (Signed)
Impression and Recommendations:    1. Mixed hyperlipidemia   2. Hypertriglyceridemia   3. Hypothyroidism   4. Prediabetes   5. Mood disorder ([place])   6. Colon cancer screening     1. Mixed Hyperlipidemia, Hypertriglyceridemia - Lab re-check today. - Continue treatment plan as prescribed.  See med list below. - Patient tolerating meds well without complication.  Denies S-E. - Will continue to monitor.  - Dietary changes such as low saturated & trans fat and low carb/ ketogenic diets discussed with patient.  Encouraged regular exercise and weight loss when appropriate.   - Educational handouts provided at patient's desire.  2. Hypothyroidism - Stable at this time.  Will continue to monitor. - Continue treatment plan as prescribed.  See med list below. - Patient tolerating meds well without complication.  Denies S-E  3. Prediabetes - Stable at this time.  Will continue to monitor. - A1c drawn today.  - Counseled patient on prevention of diabetes and discussed dietary and lifestyle modifications as first line.  Importance of low carb/ketogenic diet discussed with patient in addition to regular exercise.   4. Mood Disorder - Stable at this time.  Will continue to monitor.  5. BMI Counseling Explained to patient what BMI refers to, and what it means medically.  Told patient to think about it as a "medical risk stratification measurement" and how increasing BMI is associated with increasing risk/ or worsening state of various diseases such as hypertension, hyperlipidemia, diabetes, premature OA, depression etc.  American Heart Association guidelines for healthy diet, basically Mediterranean diet, and exercise guidelines of 30 minutes 5 days per week or more discussed in detail.  Health counseling performed.  All questions answered.  6. Lifestyle & Preventative Health Maintenance - Advised patient to continue working toward exercising to improve overall mental, physical,  and emotional health.    - Encouraged patient to engage in daily physical activity, especially a formal exercise routine.  Recommended that the patient eventually strive for at least 150 minutes of moderate cardiovascular activity per week according to guidelines established by the [facility].   - Healthy dietary habits encouraged, including low-carb, and high amounts of lean protein in diet.   - Patient should also consume adequate amounts of water.   Education and routine counseling performed. Handouts provided.  7. Follow-Up - Prescriptions provided and refilled today PRN. - Need for fasting lab work & labs as recommended.  A1c drawn today. - Encouraged patient to obtain colonoscopy as recommended and scheduled.  - Otherwise, continue to return for CPE and chronic follow-up as scheduled.  - Patient knows to call in sooner if desired to address acute concerns.    Orders Placed This Encounter  Procedures  . T4, free  . TSH  . Lipid panel  . Hemoglobin A1c  . T3, free  . Ambulatory referral to Gastroenterology    Medications Discontinued During This Encounter  Medication Reason  . FLUoxetine (PROZAC) 20 MG tablet Patient Preference  . levothyroxine (SYNTHROID, LEVOTHROID) 502 MCG tablet Duplicate  . levothyroxine (SYNTHROID, LEVOTHROID) 774 MCG tablet Duplicate  . atorvastatin (LIPITOR) 20 MG tablet Duplicate  . atorvastatin (LIPITOR) 20 MG tablet Reorder  . levothyroxine (SYNTHROID, LEVOTHROID) 175 MCG tablet Reorder  . levothyroxine (SYNTHROID, LEVOTHROID) 200 MCG tablet Reorder  . atorvastatin (LIPITOR) 20 MG tablet Reorder  . levothyroxine (SYNTHROID, LEVOTHROID) 175 MCG tablet Reorder  . levothyroxine (SYNTHROID, LEVOTHROID) 200 MCG tablet Reorder     Meds ordered this encounter  Medications  . DISCONTD: atorvastatin (LIPITOR) 20 MG tablet    Sig: Take 1 tablet (20 mg total) by mouth daily. PATIENT MUST HAVE OFFICE VISIT PRIOR TO ANY FURTHER REFILLS    Dispense:  30  tablet    Refill:  0  . DISCONTD: levothyroxine (SYNTHROID, LEVOTHROID) 175 MCG tablet    Sig: Take 1 tablet (175 mcg total) by mouth daily before breakfast. Alternate with 257mg tablets.  PATIENT MUST HAVE OFFICE VISIT PRIOR TO ANY FURTHER REFILLS    Dispense:  30 tablet    Refill:  0  . DISCONTD: levothyroxine (SYNTHROID, LEVOTHROID) 200 MCG tablet    Sig: Take 1 tablet (200 mcg total) by mouth daily before breakfast. Alternate with 1760m tablets.  PATIENT MUST HAVE OFFICE VISIT PRIOR TO ANY FURTHER REFILLS.    Dispense:  30 tablet    Refill:  0  . atorvastatin (LIPITOR) 20 MG tablet    Sig: Take 1 tablet (20 mg total) by mouth daily.    Dispense:  90 tablet    Refill:  1  . levothyroxine (SYNTHROID, LEVOTHROID) 175 MCG tablet    Sig: Take 1 tablet (175 mcg total) by mouth every other day. Alternate with 20081mtablets.    Dispense:  45 tablet    Refill:  1  . levothyroxine (SYNTHROID, LEVOTHROID) 200 MCG tablet    Sig: Take 1 tablet (200 mcg total) by mouth every other day. Alternate with 175m8mablets.    Dispense:  45 tablet    Refill:  1    Gross side effects, risk and benefits, and alternatives of medications and treatment plan in general discussed with patient.  Patient is aware that all medications have potential side effects and we are unable to predict every side effect or drug-drug interaction that may occur.   Patient will call with any questions prior to using medication if they have concerns.  Expresses verbal understanding and consents to current therapy and treatment regimen.  No barriers to understanding were identified.  Red flag symptoms and signs discussed in detail.  Patient expressed understanding regarding what to do in case of emergency\urgent symptoms  Please see AVS handed out to patient at the end of our visit for further patient instructions/ counseling done pertaining to today's office visit.   Return for (1) around march f/up for yrly physical; then OV  in  72mo 37mo me chronic care.     Note:  This document was prepared using Dragon voice recognition software and may include unintentional dictation errors.   This document serves as a record of services personally performed by DeborMellody Dance It was created on her behalf by KatheToni Amendrained medical scribe. The creation of this record is based on the scribe's personal observations and the provider's statements to them.   I have reviewed the above medical documentation for accuracy and completeness and I concur.  DeborMellody Dance4/19 7:00 AM   ----------------------------------------------------------------------------------------------------------------------------------------------    Subjective:    CC:  Chief Complaint  Patient presents with  . Follow-up    HPI: Andrew Blair 52 y.55 [facility].   States he's been "not bad."  "I've been feeling pretty good."  Patient is on thyroid medications and ready to re-check his cholesterol today.  Weight Concerns Feels he has gained weight.  States "I think every body has a framework limit, and mine is 223-224."  Notes "when I get over 224 lbs, in any facet or manner, I start having weird pains and inflammation."  Conversely, states that if he gets down below 220, "I feel like I'm bouncing around like a fairy or something."  Alcohol Consumption - Per pt, trying to limit intake Feels he is not over-indulging with alcohol.  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  1. 52 y.o. male here for cholesterol follow-up.   - Patient reports good compliance with medications or treatment plan.  Continues on Lipitor nightly, one pill.  Says "I haven't been eating as well as I could be, and I can feel it."  Feels  he's been working a lot and has had a hard time controlling what he eats as a result of this.  - Denies medication S-E.   - Smoking Status noted   - He denies new onset of: chest pain, exercise intolerance, shortness of breath, dizziness, visual changes, headache, lower extremity swelling or claudication.   Denies myalgias.  The cholesterol last visit was:  Lab Results  Component Value Date   CHOL 213 (H) 01/16/2018   HDL 56 01/16/2018   LDLCALC 120 (H) 01/16/2018   TRIG 185 (H) 01/16/2018   CHOLHDL 3.8 01/16/2018    Hepatic Function Latest Ref Rng & Units 05/27/2017 09/11/2016 03/08/2016  Total Protein 6.0 - 8.5 g/dL 7.1 6.8 7.3  Albumin 3.5 - 5.5 g/dL 4.6 4.4 4.4  AST 0 - 40 IU/L 24 23 34  ALT 0 - 44 IU/L 34 33 39  Alk Phosphatase 39 - 117 IU/L 55 44 75  Total Bilirubin 0.0 - 1.2 mg/dL 0.5 0.9 0.6    Depression screen [facility] 2/9 01/16/2018 05/22/2017 09/13/2016  Decreased Interest 0 1 0  Down, Depressed, Hopeless 0 2 0  PHQ - 2 Score 0 3 0  Altered sleeping 0 0 -  Tired, decreased energy 1 2 -  Change in appetite 0 1 -  Feeling bad or failure about yourself  1 1 -  Trouble concentrating 0 1 -  Moving slowly or fidgety/restless 0 0 -  Suicidal thoughts 0 0 -  PHQ-9 Score 2 8 -  Difficult doing work/chores Not difficult at all Somewhat difficult -     No flowsheet data found.   Wt Readings from Last 3 Encounters:  01/16/18 227 lb (103 kg)  05/22/17 223 lb (101.2 kg)  09/24/16 221 lb 12.8 oz (100.6 kg)   BP Readings from Last 3 Encounters:  01/16/18 114/79  05/22/17 127/85  09/24/16 115/77   Pulse Readings from Last 3 Encounters:  01/16/18 71  05/22/17 73  09/24/16 68   BMI Readings from Last 3 Encounters:  01/16/18 28.37 kg/m  05/22/17 28.25 kg/m  09/24/16 28.10 kg/m         Patient Care Team    Relationship Specialty Notifications Start End  Mellody Dance, DO PCP - General Family Medicine  08/09/15      Patient Active Problem List    Diagnosis Date Noted  . Hypertriglyceridemia 10/08/2015    Priority: High  . HLD (hyperlipidemia) 08/29/2015    Priority: High  . Prediabetes 08/29/2015    Priority: High  . Hypothyroidism 08/29/2015    Priority: High  . Mood disorder ([place]) 05/22/2017    Priority: Medium  . Remote History of cocaine  use- for about 10 yrs 09/04/2016    Priority: Medium  . Family history of coronary artery disease- Annamarie Major- AMI early 7's; Dad- stents in early 60's) 09/04/2016    Priority: Medium  . History of smoking 09/05/2015    Priority: Medium  . GERD (gastroesophageal reflux disease) 08/29/2015    Priority: Medium  . Irritable bowel syndrome with constipation 08/12/2015    Priority: Medium  . Acute gross stress reaction / anxiety 08/12/2015    Priority: Medium  . Family history of prostate cancer in father- 56 yo at onset 09/04/2016    Priority: Low  . Adrenal adenoma, left 09/04/2016    Priority: Low  . Fatigue 09/13/2015    Priority: Low  . Vitamin D deficiency 08/29/2015    Priority: Low  . Environmental and seasonal allergies 02/20/2011    Priority: Low  . Excessive drinking alcohol-  situational  05/22/2017  . Blackout spell 05/22/2017  . Low testosterone in male 05/22/2017  . Adjustment disorder with mixed anxiety and depressed mood 09/13/2016  . Colon cancer screening 09/04/2016  . Overweight (BMI 25.0-29.9) 09/04/2016  . Paronychia of finger of right hand 09/08/2015  . Cough 08/09/2015  . Abnormal CXR 08/09/2015  . ETD (eustachian tube dysfunction) 08/09/2015    Past Medical history, Surgical history, Family history, Social history, Allergies and Medications have been entered into the medical record, reviewed and changed as needed.    Current Meds  Medication Sig  . atorvastatin (LIPITOR) 20 MG tablet Take 1 tablet (20 mg total) by mouth daily.  . cholecalciferol (VITAMIN D) 1000 units tablet Take 5,000 Units by mouth daily.  Marland Kitchen levothyroxine (SYNTHROID, LEVOTHROID)  175 MCG tablet Take 1 tablet (175 mcg total) by mouth every other day. Alternate with 216mg tablets.  .Marland Kitchenlevothyroxine (SYNTHROID, LEVOTHROID) 200 MCG tablet Take 1 tablet (200 mcg total) by mouth every other day. Alternate with 1729m tablets.  . Omega-3 Fatty Acids (FISH OIL) 1000 MG CPDR Take 4 capsules by mouth daily.  . Vitamin D, Ergocalciferol, (DRISDOL) 50000 units CAPS capsule Take 1 capsule (50,000 Units total) by mouth every 7 (seven) days.  . [DISCONTINUED] atorvastatin (LIPITOR) 20 MG tablet Take 1 tablet (20 mg total) by mouth daily. PATIENT MUST HAVE OFFICE VISIT PRIOR TO ANY FURTHER REFILLS  . [DISCONTINUED] atorvastatin (LIPITOR) 20 MG tablet Take 1 tablet (20 mg total) by mouth daily. PATIENT MUST HAVE OFFICE VISIT PRIOR TO ANY FURTHER REFILLS  . [DISCONTINUED] levothyroxine (SYNTHROID, LEVOTHROID) 175 MCG tablet Take 1 tablet (175 mcg total) by mouth daily before breakfast. Alternate with 20049mtablets.  PATIENT MUST HAVE OFFICE VISIT PRIOR TO ANY FURTHER REFILLS  . [DISCONTINUED] levothyroxine (SYNTHROID, LEVOTHROID) 175 MCG tablet Take 1 tablet (175 mcg total) by mouth daily before breakfast. Alternate with 200m42mablets.  PATIENT MUST HAVE OFFICE VISIT PRIOR TO ANY FURTHER REFILLS  . [DISCONTINUED] levothyroxine (SYNTHROID, LEVOTHROID) 200 MCG tablet Take 1 tablet (200 mcg total) by mouth daily before breakfast. Alternate with 175mc69mblets.  PATIENT MUST HAVE OFFICE VISIT PRIOR TO ANY FURTHER REFILLS.  . [DISCONTINUED] levothyroxine (SYNTHROID, LEVOTHROID) 200 MCG tablet Take 1 tablet (200 mcg total) by mouth daily before breakfast. Alternate with 175mcg37mlets.  PATIENT MUST HAVE OFFICE VISIT PRIOR TO ANY FURTHER REFILLS.    Allergies:  No Known Allergies   Review of Systems: Review of Systems: General:   No F/C, wt loss Pulm:   No DIB, SOB, pleuritic chest pain Card:  No CP, palpitations Abd:  No  n/v/d or pain Ext:  No inc edema from baseline Psych: no SI/  HI    Objective:   Blood pressure 114/79, pulse 71, height '6\' 3"'$  (1.905 m), weight 227 lb (103 kg), SpO2 98 %. Body mass index is 28.37 kg/m. General:  Well Developed, well nourished, appropriate for stated age.  Neuro:  Alert and oriented,  extra-ocular muscles intact  HEENT:  Normocephalic, atraumatic, neck supple, no carotid bruits appreciated  Skin:  no gross rash, warm, pink. Cardiac:  RRR, S1 S2 Respiratory:  ECTA B/L and A/P, Not using accessory muscles, speaking in full sentences- unlabored. Vascular:  Ext warm, no cyanosis apprec.; cap RF less 2 sec. Psych:  No HI/SI, judgement and insight good, Euthymic mood. Full Affect.

## 2018-01-17 LAB — HEMOGLOBIN A1C
ESTIMATED AVERAGE GLUCOSE: 114 mg/dL
HEMOGLOBIN A1C: 5.6 % (ref 4.8–5.6)

## 2018-01-17 LAB — T4, FREE: Free T4: 1.12 ng/dL (ref 0.82–1.77)

## 2018-01-17 LAB — TSH: TSH: 5.36 u[IU]/mL — AB (ref 0.450–4.500)

## 2018-01-17 LAB — LIPID PANEL
CHOL/HDL RATIO: 3.8 ratio (ref 0.0–5.0)
Cholesterol, Total: 213 mg/dL — ABNORMAL HIGH (ref 100–199)
HDL: 56 mg/dL (ref >39)
LDL Calculated: 120 mg/dL — ABNORMAL HIGH (ref 0–99)
Triglycerides: 185 mg/dL — ABNORMAL HIGH (ref 0–149)
VLDL CHOLESTEROL CAL: 37 mg/dL (ref 5–40)

## 2018-01-17 LAB — T3, FREE: T3, Free: 2.8 pg/mL (ref 2.0–4.4)

## 2018-01-21 ENCOUNTER — Other Ambulatory Visit: Payer: Self-pay

## 2018-01-21 DIAGNOSIS — E782 Mixed hyperlipidemia: Secondary | ICD-10-CM

## 2018-01-21 MED ORDER — ATORVASTATIN CALCIUM 40 MG PO TABS: 40.0000 mg | ORAL_TABLET | Freq: Every day | ORAL | 1 refills | Status: DC

## 2018-01-21 NOTE — Progress Notes (Signed)
Per Dr. Raliegh Scarlet lab result note sent in Lipitor 40 mg 1 po qd # 90 1 RF.  Patient notified. MPulliam, CMA/RT(R)

## 2018-02-20 ENCOUNTER — Encounter: Payer: Self-pay | Admitting: Family Medicine

## 2018-03-13 ENCOUNTER — Other Ambulatory Visit (INDEPENDENT_AMBULATORY_CARE_PROVIDER_SITE_OTHER): Payer: 59

## 2018-03-13 DIAGNOSIS — E782 Mixed hyperlipidemia: Secondary | ICD-10-CM

## 2018-03-14 LAB — ALT: ALT: 53 IU/L — AB (ref 0–44)

## 2018-05-26 ENCOUNTER — Other Ambulatory Visit: Payer: Self-pay | Admitting: Family Medicine

## 2018-05-26 DIAGNOSIS — E559 Vitamin D deficiency, unspecified: Secondary | ICD-10-CM

## 2018-09-22 ENCOUNTER — Ambulatory Visit: Payer: 59 | Admitting: Family Medicine

## 2018-10-13 ENCOUNTER — Ambulatory Visit: Payer: Self-pay | Admitting: Family Medicine

## 2018-12-11 ENCOUNTER — Other Ambulatory Visit: Payer: Self-pay

## 2018-12-11 DIAGNOSIS — Z20822 Contact with and (suspected) exposure to covid-19: Secondary | ICD-10-CM

## 2018-12-12 LAB — NOVEL CORONAVIRUS, NAA: SARS-CoV-2, NAA: NOT DETECTED

## 2019-02-20 ENCOUNTER — Other Ambulatory Visit: Payer: Self-pay

## 2019-02-20 DIAGNOSIS — Z20822 Contact with and (suspected) exposure to covid-19: Secondary | ICD-10-CM

## 2019-02-21 LAB — NOVEL CORONAVIRUS, NAA: SARS-CoV-2, NAA: NOT DETECTED

## 2019-03-02 ENCOUNTER — Other Ambulatory Visit: Payer: Self-pay

## 2019-03-02 DIAGNOSIS — Z20822 Contact with and (suspected) exposure to covid-19: Secondary | ICD-10-CM

## 2019-03-03 LAB — NOVEL CORONAVIRUS, NAA: SARS-CoV-2, NAA: NOT DETECTED

## 2019-03-06 ENCOUNTER — Other Ambulatory Visit: Payer: Self-pay | Admitting: Family Medicine

## 2019-03-06 DIAGNOSIS — E079 Disorder of thyroid, unspecified: Secondary | ICD-10-CM

## 2019-03-06 DIAGNOSIS — E782 Mixed hyperlipidemia: Secondary | ICD-10-CM

## 2019-03-17 ENCOUNTER — Other Ambulatory Visit: Payer: Self-pay

## 2019-03-17 ENCOUNTER — Encounter: Payer: Self-pay | Admitting: Family Medicine

## 2019-03-17 ENCOUNTER — Ambulatory Visit (INDEPENDENT_AMBULATORY_CARE_PROVIDER_SITE_OTHER): Payer: Self-pay | Admitting: Family Medicine

## 2019-03-17 VITALS — Ht 75.0 in | Wt 220.0 lb

## 2019-03-17 DIAGNOSIS — E079 Disorder of thyroid, unspecified: Secondary | ICD-10-CM

## 2019-03-17 DIAGNOSIS — E782 Mixed hyperlipidemia: Secondary | ICD-10-CM

## 2019-03-17 DIAGNOSIS — Z9114 Patient's other noncompliance with medication regimen: Secondary | ICD-10-CM | POA: Insufficient documentation

## 2019-03-17 DIAGNOSIS — E781 Pure hyperglyceridemia: Secondary | ICD-10-CM

## 2019-03-17 DIAGNOSIS — Z91148 Patient's other noncompliance with medication regimen for other reason: Secondary | ICD-10-CM | POA: Insufficient documentation

## 2019-03-17 DIAGNOSIS — E559 Vitamin D deficiency, unspecified: Secondary | ICD-10-CM

## 2019-03-17 DIAGNOSIS — R7303 Prediabetes: Secondary | ICD-10-CM

## 2019-03-17 MED ORDER — ATORVASTATIN CALCIUM 40 MG PO TABS: 40.0000 mg | ORAL_TABLET | Freq: Every day | ORAL | 0 refills | Status: DC

## 2019-03-17 MED ORDER — LEVOTHYROXINE SODIUM 200 MCG PO TABS: 200.0000 ug | ORAL_TABLET | ORAL | 0 refills | Status: DC

## 2019-03-17 MED ORDER — LEVOTHYROXINE SODIUM 175 MCG PO TABS: 175.0000 ug | ORAL_TABLET | ORAL | 0 refills | Status: DC

## 2019-03-17 NOTE — Progress Notes (Signed)
Telehealth office visit note for Mellody Dance, D.O- at Primary Care at [facility]   I connected with current patient today and verified that I am speaking with the correct person using two identifiers.   . Location of the patient: Home . Location of the provider: Office Only the patient (+/- their family members at pt's discretion) and myself were participating in the encounter - This visit type was conducted due to national recommendations for restrictions regarding the COVID-19 Pandemic (e.g. social distancing) in an effort to limit this patient's exposure and mitigate transmission in our community.  This format is felt to be most appropriate for this patient at this time.   - The patient did not have access to video technology or had technical difficulties with video requiring transitioning to audio format only. - No physical exam could be performed with this format, beyond that communicated to [place] by the patient/ family members as noted.   - Additionally my office staff/ schedulers discussed with the patient that there may be a monetary charge related to this service, depending on their medical insurance.   The patient expressed understanding, and agreed to proceed.       History of Present Illness: No chief complaint on file.   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  Notes he's doing pretty good.  Says "we all came out unscathed; the rest of the family got tested, and it was negative."  Says he's been tested about six times.  Notes Lilia Pro had "low-grade fever, some respiratory, sore throat, cough, then after a week, the only thing left was just being really tired."  He thinks she was singing in the shower again by day nine.  Notes she had her own cup, her own bowl, and they used rubbing alcohol etc., to keep everything sanitized.  He kept himself up to date with guidelines on the [place] website.    He speaks extensively regarding his thoughts about  COVID at this time.  Says he and Joellen Jersey last got really sick in December of 2019, and he wonders if he had COVID before anyone knew what it was.  States "I think the entire year of 2020 I had one cold."  Says he's been feeling good otherwise, hasn't had any major issues.   Notes Joellen Jersey is turning 51 soon, and they are having a small group of eight people coming over to celebrate.  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  He is here today because he went to refill his prescriptions, and was told he needed to schedule another appointment before he could receive them.  - Supplementation Takes tumeric, fish oil.  - Vitamin D Deficiency Continues Vitamin D 2000-3000 IU's daily, plus once-weekly prescription.  - Alcohol Use Says he probably averages three alcoholic drinks per week.  HPI:  Hyperlipidemia:  53 y.o. male here for cholesterol follow-up.   - Patient reports good compliance with treatment plan of:  medication and/ or lifestyle management.    - Patient denies any acute concerns or problems with management plan.  - He denies new onset of: myalgias, arthralgias, increased fatigue more than normal, chest pains, exercise intolerance, shortness of breath, dizziness, visual changes, headache, lower extremity swelling or claudication.   Most recent cholesterol panel was:  Lab Results  Component Value Date   CHOL 213 (H) 01/16/2018   HDL 56 01/16/2018   LDLCALC 120 (H) 01/16/2018   TRIG 185 (H)  01/16/2018   CHOLHDL 3.8 01/16/2018   Hepatic Function Latest Ref Rng & Units 03/13/2018 05/27/2017 09/11/2016  Total Protein 6.0 - 8.5 g/dL - 7.1 6.8  Albumin 3.5 - 5.5 g/dL - 4.6 4.4  AST 0 - 40 IU/L - 24 23  ALT 0 - 44 IU/L 53(H) 34 33  Alk Phosphatase 39 - 117 IU/L - 55 44  Total Bilirubin 0.0 - 1.2 mg/dL - 0.5 0.9     GAD 7 : Generalized Anxiety Score 03/17/2019  Nervous, Anxious, on Edge 0  Control/stop worrying 0  Worry too much - different things 0  Trouble relaxing  1  Restless 0  Easily annoyed or irritable 0  Afraid - awful might happen 1  Total GAD 7 Score 2  Anxiety Difficulty Somewhat difficult    Depression screen [facility] 2/9 03/17/2019 01/16/2018 05/22/2017 09/13/2016  Decreased Interest 0 0 1 0  Down, Depressed, Hopeless 0 0 2 0  PHQ - 2 Score 0 0 3 0  Altered sleeping 2 0 0 -  Tired, decreased energy '1 1 2 '$ -  Change in appetite 0 0 1 -  Feeling bad or failure about yourself  0 1 1 -  Trouble concentrating 0 0 1 -  Moving slowly or fidgety/restless 0 0 0 -  Suicidal thoughts 0 0 0 -  PHQ-9 Score '3 2 8 '$ -  Difficult doing work/chores Somewhat difficult Not difficult at all Somewhat difficult -      Impression and Recommendations:    1. Mixed hyperlipidemia   2. Hypertriglyceridemia   3. Hypothyroidism   4. Prediabetes   5. Vitamin D deficiency   6. Hx of medication noncompliance      Mixed hyperlipidemia, hypertriglyceridemia  - Last FLP drawn 01/16/2018:  - Triglycerides = 185, elevated, up from 140 prior. - HDL = 56, down from 63 prior. - LDL = 120, elevated, down from 123 prior.  - ALT = 53, elevated, December of 2019, up from 48 in March of 2019. - Told patient to continue to avoid heavy use of alcohol or hepatotoxic substances.  - Discussed that patient is overdue for routine lab work. - Reviewed critical need to monitor patient's liver enzymes while managed on statin.  - Told patient to take his cholesterol medicine at night. - Pt will continue current treatment regimen.  See med list.  - Dietary changes such as low saturated & trans fat diets for hyperlipidemia and low carb diets for hypertriglyceridemia discussed with patient.    - Encouraged patient to follow AHA guidelines for regular exercise and also engage in weight loss if BMI above 25.   - Educational handouts provided at patient's desire and/ or told to look online at the [place] website for further information.  - We will continue to  monitor and re-check as discussed.  Hypothyroidism  - Patient has not been taking his synthroid as prescribed.  - Told patient of need to take his synthroid apart from other vitamins and meds.  - No changes made to treatment plan today.  Continue as prescribed.  - Will continue to monitor and re-check as discussed.  Prediabetes  - 5.6 last check one year ago, stable from 5.6 in March of 2019. - Will continue to monitor and re-check as discussed.  Vitamin D deficiency  - 44.2 last check one year ago, up from 37.5 two years prior.  - Patient continues 2000-3000 IU's daily and once-weekly prescription. - No changes made to treatment plan  today.  Continue management as established.  - Will continue to monitor and re-check as discussed.  Recommendations - Last labs drawn in 10 of 2019. - Need for full fasting lab work near future. - Discussed that once patient resumes synthroid, thyroid labs will be re-checked in 8 weeks. - Need for CPE near future.  COVID-19 Counseling - Novel Covid -19 counseling done; all questions were answered.   - Current CDC / federal and [place] guidelines reviewed with patient  - Reminded pt of extreme importance of social distancing; wearing a mask when out in public; insensate handwashing and cleaning of surfaces, avoiding unnecessary trips for shopping and avoiding ALL but emergency appts etc. - Told patient to be prepared, not scared; and be smart for the sake of others - Patient will call with any additional concerns   - As part of my medical decision making, I reviewed the following data within the [place] History obtained from pt /family, CMA notes reviewed and incorporated if applicable, Labs reviewed, Radiograph/ tests reviewed if applicable and OV notes from prior OV's with me, as well as other specialists she/he has seen since seeing me last, were all reviewed and used in my medical decision making process today.    -  Additionally, discussion had with patient regarding our treatment plan, and their biases/concerns about that plan were used in my medical decision making today.    - The patient agreed with the plan and demonstrated an understanding of the instructions.   No barriers to understanding were identified.     Return for FBW next 7days + next available CPE appt.    Meds ordered this encounter  Medications  . levothyroxine (SYNTHROID) 175 MCG tablet    Sig: Take 1 tablet (175 mcg total) by mouth every other day. Alternate with 296mg tablets.    Dispense:  45 tablet    Refill:  0  . levothyroxine (SYNTHROID) 200 MCG tablet    Sig: Take 1 tablet (200 mcg total) by mouth every other day. Alternate with 1766m tablets.    Dispense:  45 tablet    Refill:  0  . atorvastatin (LIPITOR) 40 MG tablet    Sig: Take 1 tablet (40 mg total) by mouth daily.    Dispense:  90 tablet    Refill:  0    Medications Discontinued During This Encounter  Medication Reason  . levothyroxine (SYNTHROID, LEVOTHROID) 175 MCG tablet Reorder  . levothyroxine (SYNTHROID, LEVOTHROID) 200 MCG tablet Reorder  . atorvastatin (LIPITOR) 40 MG tablet Reorder     I provided 28 minutes of non face-to-face time during this encounter.  Additional time was spent with charting and coordination of care before and after the actual visit commenced.   Note:  This note was prepared with assistance of Dragon voice recognition software. Occasional wrong-word or sound-a-like substitutions may have occurred due to the inherent limitations of voice recognition software.  This document serves as a record of services personally performed by DeMellody DanceDO. It was created on her behalf by KaToni Amenda trained medical scribe. The creation of this record is based on the scribe's personal observations and the provider's statements to them.   This case required medical decision making of at least moderate complexity. The above  documentation has been reviewed to be accurate and was completed by DeMarjory SneddonD.O.       Patient Care Team    Relationship Specialty Notifications Start End  OpMellody Dance  DO PCP - General Family Medicine  08/09/15      -Vitals obtained; medications/ allergies reconciled;  personal medical, social, Sx etc.histories were updated by CMA, reviewed by me and are reflected in chart   Patient Active Problem List   Diagnosis Date Noted  . Hypertriglyceridemia 10/08/2015  . HLD (hyperlipidemia) 08/29/2015  . Prediabetes 08/29/2015  . Hypothyroidism 08/29/2015  . Mood disorder ([place]) 05/22/2017  . Remote History of cocaine use- for about 10 yrs 09/04/2016  . Family history of coronary artery disease- Annamarie Major- AMI early 91's; Dad- stents in early 60's) 09/04/2016  . History of smoking 09/05/2015  . GERD (gastroesophageal reflux disease) 08/29/2015  . Irritable bowel syndrome with constipation 08/12/2015  . Acute gross stress reaction / anxiety 08/12/2015  . Family history of prostate cancer in father- 83 yo at onset 09/04/2016  . Adrenal adenoma, left 09/04/2016  . Fatigue 09/13/2015  . Vitamin D deficiency 08/29/2015  . Environmental and seasonal allergies 02/20/2011  . Hx of medication noncompliance 03/17/2019  . Excessive drinking alcohol-  situational  05/22/2017  . Blackout spell 05/22/2017  . Low testosterone in male 05/22/2017  . Adjustment disorder with mixed anxiety and depressed mood 09/13/2016  . Colon cancer screening 09/04/2016  . Overweight (BMI 25.0-29.9) 09/04/2016  . Paronychia of finger of right hand 09/08/2015  . Cough 08/09/2015  . Abnormal CXR 08/09/2015  . ETD (eustachian tube dysfunction) 08/09/2015     Current Meds  Medication Sig  . atorvastatin (LIPITOR) 40 MG tablet Take 1 tablet (40 mg total) by mouth daily.  . cholecalciferol (VITAMIN D) 1000 units tablet Take 5,000 Units by mouth daily.  Marland Kitchen levothyroxine (SYNTHROID) 175 MCG tablet  Take 1 tablet (175 mcg total) by mouth every other day. Alternate with 256mg tablets.  .Marland Kitchenlevothyroxine (SYNTHROID) 200 MCG tablet Take 1 tablet (200 mcg total) by mouth every other day. Alternate with 179m tablets.  . Omega-3 Fatty Acids (FISH OIL) 1000 MG CPDR Take 4 capsules by mouth daily.  . Vitamin D, Ergocalciferol, (DRISDOL) 1.25 MG (50000 UT) CAPS capsule TAKE 1 CAPSULE BY MOUTH EVERY 7 DAYS.  . [DISCONTINUED] atorvastatin (LIPITOR) 40 MG tablet Take 1 tablet (40 mg total) by mouth daily.  . [DISCONTINUED] levothyroxine (SYNTHROID, LEVOTHROID) 175 MCG tablet Take 1 tablet (175 mcg total) by mouth every other day. Alternate with 20042mtablets.  . [DISCONTINUED] levothyroxine (SYNTHROID, LEVOTHROID) 200 MCG tablet Take 1 tablet (200 mcg total) by mouth every other day. Alternate with 175m27mablets.     Allergies:  No Known Allergies   ROS:  See above HPI for pertinent positives and negatives   Objective:   Height '6\' 3"'$  (1.905 m), weight 220 lb (99.8 kg).  (if some vitals are omitted, this means that patient was UNABLE to obtain them even though they were asked to get them prior to OV today.  They were asked to call us [place] earliest convenience with these once obtained. )  General: A & O * 3; sounds in no acute distress; in usual state of health.  Skin: Pt confirms warm and dry extremities and pink fingertips HEENT: Pt confirms lips non-cyanotic Chest: Patient confirms normal chest excursion and movement Respiratory: speaking in full sentences, no conversational dyspnea; patient confirms no use of accessory muscles Psych: insight appears good, mood- appears full

## 2019-05-14 ENCOUNTER — Telehealth: Payer: Self-pay | Admitting: Family Medicine

## 2019-05-14 DIAGNOSIS — E559 Vitamin D deficiency, unspecified: Secondary | ICD-10-CM

## 2019-05-14 DIAGNOSIS — E079 Disorder of thyroid, unspecified: Secondary | ICD-10-CM

## 2019-05-14 DIAGNOSIS — E781 Pure hyperglyceridemia: Secondary | ICD-10-CM

## 2019-05-14 DIAGNOSIS — E782 Mixed hyperlipidemia: Secondary | ICD-10-CM

## 2019-05-14 DIAGNOSIS — R7303 Prediabetes: Secondary | ICD-10-CM

## 2019-05-14 NOTE — Telephone Encounter (Signed)
Forwarding med asst message to review pt's chart for lab order, he want Thyroid meds & has scheduled an OV but wants to come in for labs to for Rx refill of :  levothyroxine (SYNTHROID) 175 MCG tablet [id number]   Order Details Dose: 175 mcg Route: Oral Frequency: Every other day  Dispense Quantity: 45 tablet Refills: 0       Sig: Take 1 tablet (175 mcg total) by mouth every other day. Alternate with 221mcg tablets.   --glh

## 2019-05-15 NOTE — Telephone Encounter (Signed)
Patient last seen 03/17/19. Last labs done 2019. Ok to place order for TSH?

## 2019-05-17 NOTE — Telephone Encounter (Signed)
No, patient will need a CPE and fasting blood work as per noted in my last office visit note

## 2019-05-18 NOTE — Telephone Encounter (Signed)
Patient is scheduled for apt on 06/10/19 for CPE and fasting labs. Future labs ordered. Patient is aware of the information below and verbalized understanding. AS, CMA

## 2019-06-10 ENCOUNTER — Other Ambulatory Visit: Payer: Self-pay

## 2019-06-10 ENCOUNTER — Ambulatory Visit (INDEPENDENT_AMBULATORY_CARE_PROVIDER_SITE_OTHER): Payer: 59 | Admitting: Family Medicine

## 2019-06-10 ENCOUNTER — Encounter: Payer: Self-pay | Admitting: Family Medicine

## 2019-06-10 VITALS — BP 118/78 | HR 74 | Temp 98.1°F | Resp 10 | Ht 75.0 in | Wt 220.5 lb

## 2019-06-10 DIAGNOSIS — Z1211 Encounter for screening for malignant neoplasm of colon: Secondary | ICD-10-CM | POA: Diagnosis not present

## 2019-06-10 DIAGNOSIS — E079 Disorder of thyroid, unspecified: Secondary | ICD-10-CM | POA: Diagnosis not present

## 2019-06-10 DIAGNOSIS — R7303 Prediabetes: Secondary | ICD-10-CM | POA: Diagnosis not present

## 2019-06-10 DIAGNOSIS — E782 Mixed hyperlipidemia: Secondary | ICD-10-CM | POA: Diagnosis not present

## 2019-06-10 DIAGNOSIS — E559 Vitamin D deficiency, unspecified: Secondary | ICD-10-CM

## 2019-06-10 DIAGNOSIS — E781 Pure hyperglyceridemia: Secondary | ICD-10-CM

## 2019-06-10 NOTE — Patient Instructions (Signed)
Preventive Care, Male Preventive care refers to lifestyle choices and visits with your health care provider that can promote health and wellness. What does preventive care include?   A yearly physical exam. This is also called an annual well check.  Dental exams once or twice a year.  Routine eye exams. Ask your health care provider how often you should have your eyes checked.  Personal lifestyle choices, including: ? Daily care of your teeth and gums. ? Regular physical activity. ? Eating a healthy diet. ? Avoiding tobacco and drug use. ? Limiting alcohol use. ? Practicing safe sex. ? Taking low doses of aspirin every day. ? Taking vitamin and mineral supplements as recommended by your health care provider. What happens during an annual well check? The services and screenings done by your health care provider during your annual well check will depend on your age, overall health, lifestyle risk factors, and family history of disease. Counseling Your health care provider may ask you questions about your:  Alcohol use.  Tobacco use.  Drug use.  Emotional well-being.  Home and relationship well-being.  Sexual activity.  Eating habits.  History of falls.  Memory and ability to understand (cognition).  Work and work Statistician. Screening You may have the following tests or measurements:  Height, weight, and BMI.  Blood pressure.  Lipid and cholesterol levels. These may be checked every 5 years, or more frequently if you are over 40 years old.  Skin check.  Lung cancer screening. You may have this screening every year starting at age 42 if you have a 30-pack-year history of smoking and currently smoke or have quit within the past 15 years.  Colorectal cancer screening. All adults should have this screening starting at age 39 and continuing until age 27. You will have tests every 1-10 years, depending on your results and the type of screening test. People at  increased risk should start screening at an earlier age. Screening tests may include: ? Guaiac-based fecal occult blood testing. ? Fecal immunochemical test (FIT). ? Stool DNA test. ? Virtual colonoscopy. ? Sigmoidoscopy. During this test, a flexible tube with a tiny camera (sigmoidoscope) is used to examine your rectum and lower colon. The sigmoidoscope is inserted through your anus into your rectum and lower colon. ? Colonoscopy. During this test, a long, thin, flexible tube with a tiny camera (colonoscope) is used to examine your entire colon and rectum.  Prostate cancer screening. Recommendations will vary depending on your family history and other risks.  Hepatitis C blood test.  Hepatitis B blood test.  Sexually transmitted disease (STD) testing.  Diabetes screening. This is done by checking your blood sugar (glucose) after you have not eaten for a while (fasting). You may have this done every 1-3 years.  Abdominal aortic aneurysm (AAA) screening. You may need this if you are a current or former smoker.  Osteoporosis. You may be screened starting at age 18 if you are at high risk. Talk with your health care provider about your test results, treatment options, and if necessary, the need for more tests. Vaccines Your health care provider may recommend certain vaccines, such as:  Influenza vaccine. This is recommended every year.  Tetanus, diphtheria, and acellular pertussis (Tdap, Td) vaccine. You may need a Td booster every 10 years.  Varicella vaccine. You may need this if you have not been vaccinated.  Zoster vaccine. You may need this after age 78.  Measles, mumps, and rubella (MMR) vaccine. You may need  at least one dose of MMR if you were born in 1957 or later. You may also need a second dose. °· Pneumococcal 13-valent conjugate (PCV13) vaccine. One dose is recommended after age 65. °· Pneumococcal polysaccharide (PPSV23) vaccine. One dose is recommended after age  65. °· Meningococcal vaccine. You may need this if you have certain conditions. °· Hepatitis A vaccine. You may need this if you have certain conditions or if you travel or work in places where you may be exposed to hepatitis A. °· Hepatitis B vaccine. You may need this if you have certain conditions or if you travel or work in places where you may be exposed to hepatitis B. °· Haemophilus influenzae type b (Hib) vaccine. You may need this if you have certain risk factors. °Talk to your health care provider about which screenings and vaccines you need and how often you need them. °This information is not intended to replace advice given to you by your health care provider. Make sure you discuss any questions you have with your health care provider. °Document Released: 04/01/2015 Document Revised: 04/25/2017 Document Reviewed: 01/04/2015 °Elsevier Interactive Patient Education © 2019 [employer] ° ° ° ° ° ° ° °Preventive Care for Adults, Male °A healthy lifestyle and preventive care can promote health and wellness. Preventive health guidelines for men include the following key practices: °· A routine yearly physical is a good way to check with your health care provider about your health and preventative screening. It is a chance to share any concerns and updates on your health and to receive a thorough exam. °· Visit your dentist for a routine exam and preventative care every 6 months. Brush your teeth twice a day and floss once a day. Good oral hygiene prevents tooth decay and gum disease. °· The frequency of eye exams is based on your age, health, family medical history, use of contact lenses, and other factors. Follow your health care provider's recommendations for frequency of eye exams. °· Eat a healthy diet. Foods such as vegetables, fruits, whole grains, low-fat dairy products, and lean protein foods contain the nutrients you need without too many calories. Decrease your intake of foods high in solid fats,  added sugars, and salt. Eat the right amount of calories for you. Get information about a proper diet from your health care provider, if necessary. °· Regular physical exercise is one of the most important things you can do for your health. Most adults should get at least 150 minutes of moderate-intensity exercise (any activity that increases your heart rate and causes you to sweat) each week. In addition, most adults need muscle-strengthening exercises on 2 or more days a week. °· Maintain a healthy weight. The body mass index (BMI) is a screening tool to identify possible weight problems. It provides an estimate of body fat based on height and weight. Your health care provider can find your BMI and can help you achieve or maintain a healthy weight. For adults 20 years and older: °¨ A BMI below 18.5 is considered underweight. °¨ A BMI of 18.5 to 24.9 is normal. °¨ A BMI of 25 to 29.9 is considered overweight. °¨ A BMI of 30 and above is considered obese. °· Maintain normal blood lipids and cholesterol levels by exercising and minimizing your intake of saturated fat. Eat a balanced diet with plenty of fruit and vegetables. Blood tests for lipids and cholesterol should begin at age 20 and be repeated every 5 years. If your lipid or cholesterol   levels are high, you are over 50, or you are at high risk for heart disease, you may need your cholesterol levels checked more frequently. Ongoing high lipid and cholesterol levels should be treated with medicines if diet and exercise are not working.  If you smoke, find out from your health care provider how to quit. If you do not use tobacco, do not start.  Lung cancer screening is recommended for adults aged 71-80 years who are at high risk for developing lung cancer because of a history of smoking. A yearly low-dose CT scan of the lungs is recommended for people who have at least a 30-pack-year history of smoking and are a current smoker or have quit within the past 15  years. A pack year of smoking is smoking an average of 1 pack of cigarettes a day for 1 year (for example: 1 pack a day for 30 years or 2 packs a day for 15 years). Yearly screening should continue until the smoker has stopped smoking for at least 15 years. Yearly screening should be stopped for people who develop a health problem that would prevent them from having lung cancer treatment.  If you choose to drink alcohol, do not have more than 2 drinks per day. One drink is considered to be 12 ounces (355 mL) of beer, 5 ounces (148 mL) of wine, or 1.5 ounces (44 mL) of liquor.  Avoid use of street drugs. Do not share needles with anyone. Ask for help if you need support or instructions about stopping the use of drugs.  High blood pressure causes heart disease and increases the risk of stroke. Your blood pressure should be checked at least every 1-2 years. Ongoing high blood pressure should be treated with medicines, if weight loss and exercise are not effective.  If you are 36-60 years old, ask your health care provider if you should take aspirin to prevent heart disease.  Diabetes screening is done by taking a blood sample to check your blood glucose level after you have not eaten for a certain period of time (fasting). If you are not overweight and you do not have risk factors for diabetes, you should be screened once every 3 years starting at age 42. If you are overweight or obese and you are 20-59 years of age, you should be screened for diabetes every year as part of your cardiovascular risk assessment.  Colorectal cancer can be detected and often prevented. Most routine colorectal cancer screening begins at the age of 80 and continues through age 41. However, your health care provider may recommend screening at an earlier age if you have risk factors for colon cancer. On a yearly basis, your health care provider may provide home test kits to check for hidden blood in the stool. Use of a small camera  at the end of a tube to directly examine the colon (sigmoidoscopy or colonoscopy) can detect the earliest forms of colorectal cancer. Talk to your health care provider about this at age 18, when routine screening begins. Direct exam of the colon should be repeated every 5-10 years through age 86, unless early forms of precancerous polyps or small growths are found.  People who are at an increased risk for hepatitis B should be screened for this virus. You are considered at high risk for hepatitis B if:  You were born in a country where hepatitis B occurs often. Talk with your health care provider about which countries are considered high risk.  Your parents  were born in a high-risk country and you have not received a shot to protect against hepatitis B (hepatitis B vaccine).  You have HIV or AIDS.  You use needles to inject street drugs.  You live with, or have sex with, someone who has hepatitis B.  You are a man who has sex with other men (MSM).  You get hemodialysis treatment.  You take certain medicines for conditions such as cancer, organ transplantation, and autoimmune conditions.  Hepatitis C blood testing is recommended for all people born from [birth year] through 1965 and any individual with known risks for hepatitis C.  Practice safe sex. Use condoms and avoid high-risk sexual practices to reduce the spread of sexually transmitted infections (STIs). STIs include gonorrhea, chlamydia, syphilis, trichomonas, herpes, HPV, and human immunodeficiency virus (HIV). Herpes, HIV, and HPV are viral illnesses that have no cure. They can result in disability, cancer, and death.  If you are a man who has sex with other men, you should be screened at least once per year for:  HIV.  Urethral, rectal, and pharyngeal infection of gonorrhea, chlamydia, or both.  If you are at risk of being infected with HIV, it is recommended that you take a prescription medicine daily to prevent HIV infection. This  is called preexposure prophylaxis (PrEP). You are considered at risk if:  You are a man who has sex with other men (MSM) and have other risk factors.  You are a heterosexual man, are sexually active, and are at increased risk for HIV infection.  You take drugs by injection.  You are sexually active with a partner who has HIV.  Talk with your health care provider about whether you are at high risk of being infected with HIV. If you choose to begin PrEP, you should first be tested for HIV. You should then be tested every 3 months for as long as you are taking PrEP.  A one-time screening for abdominal aortic aneurysm (AAA) and surgical repair of large AAAs by ultrasound are recommended for men ages 42 to 30 years who are current or former smokers.  Healthy men should no longer receive prostate-specific antigen (PSA) blood tests as part of routine cancer screening. Talk with your health care provider about prostate cancer screening.  Testicular cancer screening is not recommended for adult males who have no symptoms. Screening includes self-exam, a health care provider exam, and other screening tests. Consult with your health care provider about any symptoms you have or any concerns you have about testicular cancer.  Use sunscreen. Apply sunscreen liberally and repeatedly throughout the day. You should seek shade when your shadow is shorter than you. Protect yourself by wearing long sleeves, pants, a wide-brimmed hat, and sunglasses year round, whenever you are outdoors.  Once a month, do a whole-body skin exam, using a mirror to look at the skin on your back. Tell your health care provider about new moles, moles that have irregular borders, moles that are larger than a pencil eraser, or moles that have changed in shape or color.  Stay current with required vaccines (immunizations).  Influenza vaccine. All adults should be immunized every year.  Tetanus, diphtheria, and acellular pertussis (Td,  Tdap) vaccine. An adult who has not previously received Tdap or who does not know his vaccine status should receive 1 dose of Tdap. This initial dose should be followed by tetanus and diphtheria toxoids (Td) booster doses every 10 years. Adults with an unknown or incomplete history of completing a 3-dose  immunization series with Td-containing vaccines should begin or complete a primary immunization series including a Tdap dose. Adults should receive a Td booster every 10 years.  Varicella vaccine. An adult without evidence of immunity to varicella should receive 2 doses or a second dose if he has previously received 1 dose.  Human papillomavirus (HPV) vaccine. Males aged 11-21 years who have not received the vaccine previously should receive the 3-dose series. Males aged 22-26 years may be immunized. Immunization is recommended through the age of 25 years for any male who has sex with males and did not get any or all doses earlier. Immunization is recommended for any person with an immunocompromised condition through the age of 50 years if he did not get any or all doses earlier. During the 3-dose series, the second dose should be obtained 4-8 weeks after the first dose. The third dose should be obtained 24 weeks after the first dose and 16 weeks after the second dose.  Zoster vaccine. One dose is recommended for adults aged 86 years or older unless certain conditions are present.  Measles, mumps, and rubella (MMR) vaccine. Adults born before [birth year] generally are considered immune to measles and mumps. Adults born in [birth year] or later should have 1 or more doses of MMR vaccine unless there is a contraindication to the vaccine or there is laboratory evidence of immunity to each of the three diseases. A routine second dose of MMR vaccine should be obtained at least 28 days after the first dose for students attending postsecondary schools, health care workers, or international travelers. People who received  inactivated measles vaccine or an unknown type of measles vaccine during 1963-1967 should receive 2 doses of MMR vaccine. People who received inactivated mumps vaccine or an unknown type of mumps vaccine before 1979 and are at high risk for mumps infection should consider immunization with 2 doses of MMR vaccine. Unvaccinated health care workers born before [birth year] who lack laboratory evidence of measles, mumps, or rubella immunity or laboratory confirmation of disease should consider measles and mumps immunization with 2 doses of MMR vaccine or rubella immunization with 1 dose of MMR vaccine.  Pneumococcal 13-valent conjugate (PCV13) vaccine. When indicated, a person who is uncertain of his immunization history and has no record of immunization should receive the PCV13 vaccine. All adults 62 years of age and older should receive this vaccine. An adult aged 77 years or older who has certain medical conditions and has not been previously immunized should receive 1 dose of PCV13 vaccine. This PCV13 should be followed with a dose of pneumococcal polysaccharide (PPSV23) vaccine. Adults who are at high risk for pneumococcal disease should obtain the PPSV23 vaccine at least 8 weeks after the dose of PCV13 vaccine. Adults older than 53 years of age who have normal immune system function should obtain the PPSV23 vaccine dose at least 1 year after the dose of PCV13 vaccine.  Pneumococcal polysaccharide (PPSV23) vaccine. When PCV13 is also indicated, PCV13 should be obtained first. All adults aged 40 years and older should be immunized. An adult younger than age 62 years who has certain medical conditions should be immunized. Any person who resides in a nursing home or long-term care facility should be immunized. An adult smoker should be immunized. People with an immunocompromised condition and certain other conditions should receive both PCV13 and PPSV23 vaccines. People with human immunodeficiency virus (HIV) infection  should be immunized as soon as possible after diagnosis. Immunization during chemotherapy or radiation therapy should  be avoided. Routine use of PPSV23 vaccine is not recommended for American Indians, [place] Natives, or people younger than 65 years unless there are medical conditions that require PPSV23 vaccine. When indicated, people who have unknown immunization and have no record of immunization should receive PPSV23 vaccine. One-time revaccination 5 years after the first dose of PPSV23 is recommended for people aged 19-64 years who have chronic kidney failure, nephrotic syndrome, asplenia, or immunocompromised conditions. People who received 1-2 doses of PPSV23 before age 66 years should receive another dose of PPSV23 vaccine at age 62 years or later if at least 5 years have passed since the previous dose. Doses of PPSV23 are not needed for people immunized with PPSV23 at or after age 31 years.  Meningococcal vaccine. Adults with asplenia or persistent complement component deficiencies should receive 2 doses of quadrivalent meningococcal conjugate (MenACWY-D) vaccine. The doses should be obtained at least 2 months apart. Microbiologists working with certain meningococcal bacteria, [place] recruits, people at risk during an outbreak, and people who travel to or live in countries with a high rate of meningitis should be immunized. A first-year college student up through age 80 years who is living in a residence hall should receive a dose if he did not receive a dose on or after his 16th birthday. Adults who have certain high-risk conditions should receive one or more doses of vaccine.  Hepatitis A vaccine. Adults who wish to be protected from this disease, have chronic liver disease, work with hepatitis A-infected animals, work in hepatitis A research labs, or travel to or work in countries with a high rate of hepatitis A should be immunized. Adults who were previously unvaccinated and who anticipate close  contact with an international adoptee during the first 60 days after arrival in the [place] from a country with a high rate of hepatitis A should be immunized.  Hepatitis B vaccine. Adults should be immunized if they wish to be protected from this disease, are under age 89 years and have diabetes, have chronic liver disease, have had more than one sex partner in the past 6 months, may be exposed to blood or other infectious body fluids, are household contacts or sex partners of hepatitis B positive people, are clients or workers in certain care facilities, or travel to or work in countries with a high rate of hepatitis B.  Haemophilus influenzae type b (Hib) vaccine. A previously unvaccinated person with asplenia or sickle cell disease or having a scheduled splenectomy should receive 1 dose of Hib vaccine. Regardless of previous immunization, a recipient of a hematopoietic stem cell transplant should receive a 3-dose series 6-12 months after his successful transplant. Hib vaccine is not recommended for adults with HIV infection. Preventive Service / Frequency Ages 42 to 29  Blood pressure check.** / Every 3-5 years.  Lipid and cholesterol check.** / Every 5 years beginning at age 64.  Hepatitis C blood test.** / For any individual with known risks for hepatitis C.  Skin self-exam. / Monthly.  Influenza vaccine. / Every year.  Tetanus, diphtheria, and acellular pertussis (Tdap, Td) vaccine.** / Consult your health care provider. 1 dose of Td every 10 years.  Varicella vaccine.** / Consult your health care provider.  HPV vaccine. / 3 doses over 6 months, if 57 or younger.  Measles, mumps, rubella (MMR) vaccine.** / You need at least 1 dose of MMR if you were born in [birth year] or later. You may also need a second dose.  Pneumococcal 13-valent conjugate (  PCV13) vaccine.** / Consult your health care provider.  Pneumococcal polysaccharide (PPSV23) vaccine.** / 1 to 2 doses if you smoke  cigarettes or if you have certain conditions.  Meningococcal vaccine.** / 1 dose if you are age 51 to 42 years and a Market researcher living in a residence hall, or have one of several medical conditions. You may also need additional booster doses.  Hepatitis A vaccine.** / Consult your health care provider.  Hepatitis B vaccine.** / Consult your health care provider.  Haemophilus influenzae type b (Hib) vaccine.** / Consult your health care provider. Ages 30 to 76  Blood pressure check.** / Every year.  Lipid and cholesterol check.** / Every 5 years beginning at age 38.  Lung cancer screening. / Every year if you are aged 92-80 years and have a 30-pack-year history of smoking and currently smoke or have quit within the past 15 years. Yearly screening is stopped once you have quit smoking for at least 15 years or develop a health problem that would prevent you from having lung cancer treatment.  Fecal occult blood test (FOBT) of stool. / Every year beginning at age 24 and continuing until age 51. You may not have to do this test if you get a colonoscopy every 10 years.  Flexible sigmoidoscopy** or colonoscopy.** / Every 5 years for a flexible sigmoidoscopy or every 10 years for a colonoscopy beginning at age 76 and continuing until age 32.  Hepatitis C blood test.** / For all people born from [birth year] through 1965 and any individual with known risks for hepatitis C.  Skin self-exam. / Monthly.  Influenza vaccine. / Every year.  Tetanus, diphtheria, and acellular pertussis (Tdap/Td) vaccine.** / Consult your health care provider. 1 dose of Td every 10 years.  Varicella vaccine.** / Consult your health care provider.  Zoster vaccine.** / 1 dose for adults aged 55 years or older.  Measles, mumps, rubella (MMR) vaccine.** / You need at least 1 dose of MMR if you were born in 1956 or later. You may also need a second dose.  Pneumococcal 13-valent conjugate (PCV13) vaccine.** /  Consult your health care provider.  Pneumococcal polysaccharide (PPSV23) vaccine.** / 1 to 2 doses if you smoke cigarettes or if you have certain conditions.  Meningococcal vaccine.** / Consult your health care provider.  Hepatitis A vaccine.** / Consult your health care provider.  Hepatitis B vaccine.** / Consult your health care provider.  Haemophilus influenzae type b (Hib) vaccine.** / Consult your health care provider. Ages 75 and over  Blood pressure check.** / Every year.  Lipid and cholesterol check.**/ Every 5 years beginning at age 62.  Lung cancer screening. / Every year if you are aged 79-80 years and have a 30-pack-year history of smoking and currently smoke or have quit within the past 15 years. Yearly screening is stopped once you have quit smoking for at least 15 years or develop a health problem that would prevent you from having lung cancer treatment.  Fecal occult blood test (FOBT) of stool. / Every year beginning at age 59 and continuing until age 75. You may not have to do this test if you get a colonoscopy every 10 years.  Flexible sigmoidoscopy** or colonoscopy.** / Every 5 years for a flexible sigmoidoscopy or every 10 years for a colonoscopy beginning at age 26 and continuing until age 30.  Hepatitis C blood test.** / For all people born from [birth year] through 1964 and any individual with known risks for hepatitis C.  Abdominal aortic aneurysm (AAA) screening.** / A one-time screening for ages 65 to 75 years who are current or former smokers. °· Skin self-exam. / Monthly. °· Influenza vaccine. / Every year. °· Tetanus, diphtheria, and acellular pertussis (Tdap/Td) vaccine.** / 1 dose of Td every 10 years. °· Varicella vaccine.** / Consult your health care provider. °· Zoster vaccine.** / 1 dose for adults aged 60 years or older. °· Pneumococcal 13-valent conjugate (PCV13) vaccine.** / 1 dose for all adults aged 65 years and older. °· Pneumococcal polysaccharide (PPSV23)  vaccine.** / 1 dose for all adults aged 65 years and older. °· Meningococcal vaccine.** / Consult your health care provider. °· Hepatitis A vaccine.** / Consult your health care provider. °· Hepatitis B vaccine.** / Consult your health care provider. °· Haemophilus influenzae type b (Hib) vaccine.** / Consult your health care provider. °**Family history and personal history of risk and conditions may change your health care provider's recommendations. °  °This information is not intended to replace advice given to you by your health care provider. Make sure you discuss any questions you have with your health care provider. °  °Document Released: 05/01/2001 Document Revised: 03/26/2014 Document Reviewed: 07/31/2010 °Elsevier Interactive Patient Education ©2016 [employer] ° °

## 2019-06-10 NOTE — Progress Notes (Signed)
Male physical  Impression and Recommendations:    1. Screening for colon cancer   2. Hypothyroidism   3. Mixed hyperlipidemia   4. Prediabetes   5. Vitamin D deficiency   6. Hypertriglyceridemia      1) Anticipatory Guidance: Discussed skin CA prevention and sunscreen when outside along with skin surveillance; eating a balanced and modest diet; physical activity at least 25 minutes per day or minimum of 150 min/ week moderate to intense activity.  - Advised patient to practice prudent skin surveillance.  Reviewed the A,B,C,D's of skin surveillance with patient during appointment.  - To clean his ears, advised patient to use a 1:1 mixture of half hydrogen peroxide, half rubbing alcohol twice a week.  - For prevention of foot fungus/foot concerns, advised patient to use tinactin powder.  Encouraged patient to keep his feet clean and dry, clean his boots regularly and alternate them with another pair, and let his boots dry out after work each day.  - Encouraged patient to engage in self testicular exams once monthly.  Reviewed prudent screening habits with patient during appointment.  - Encouraged patient to engage in prudent habits to maintain his urinary and pelvic floor health.  2) Immunizations / Screenings / Labs:   All immunizations are up-to-date per recommendations or will be updated today if pt allows.    - Patient understands with dental and vision screens they will schedule independently.  - Will obtain CBC, CMP, HgA1c, Lipid panel, TSH and vit D when fasting, if not already done past 12 mo/ recently   - Fasting lab work obtained today.  - Need for colonoscopy.  See orders.  - Shingles vaccine declined.  - Up to date on Hep C/HIV.  3) Weight - Body mass index is 27.56 kg/m: BMI meaning discussed with patient.  Discussed goal to improve diet habits to improve overall feelings of well being and objective health data. Improve nutrient density of diet through  increasing intake of fruits and vegetables and decreasing saturated fats, white flour products and refined sugars.   - Health counseling performed.  All questions answered.  4) Health Counseling & Preventative Maintenance: - Advised patient to continue working toward exercising to improve overall mental, physical, and emotional health.    - Reviewed the "spokes of the wheel" of mood and health management.  Stressed the importance of ongoing prudent habits, including regular exercise, appropriate sleep hygiene, healthful dietary habits, and prayer/meditation to relax.  - Encouraged patient to engage in daily physical activity as tolerated, especially a formal exercise routine.  Recommended that the patient eventually strive for at least 150 minutes of moderate cardiovascular activity per week according to guidelines established by the [facility].   - Healthy dietary habits encouraged, including low-carb, and high amounts of lean protein in diet.   - Patient should also consume adequate amounts of water.   Orders Placed This Encounter  Procedures  . Ambulatory referral to Gastroenterology    Referral Priority:   Routine    Referral Type:   Consultation    Referral Reason:   Specialty Services Required    Number of Visits Requested:   1    Return in about 6 months (around 12/11/2019) for cholesterol mgt, thyroid mgt etc.   Reminded pt important of f-up preventative CPE in 1 year.  Reminded pt again, this is in addition to any chronic care visits.    Gross side effects, risk and benefits, and alternatives of medications  discussed with patient.  Patient is aware that all medications have potential side effects and we are unable to predict every side effect or drug-drug interaction that may occur.  Expresses verbal understanding and consents to current therapy plan and treatment regimen.  Please see AVS handed out to patient at the end of our visit for further patient instructions/ counseling done  pertaining to today's office visit.   This case required medical decision making of at least moderate complexity.  This document serves as a record of services personally performed by Mellody Dance, DO. It was created on her behalf by Toni Amend, a trained medical scribe. The creation of this record is based on the scribe's personal observations and the provider's statements to them.    The above documentation from Toni Amend, medical scribe, has been reviewed by Marjory Sneddon, D.O.    Subjective:     I, Toni Amend, am serving as Education administrator for [employer].  CC: CPE  HPI: Andrew Blair is a 54 y.o. male who presents to [place] at [facility] today for a yearly health maintenance exam.    Health Maintenance Summary  - Reviewed and updated, unless pt declines services.  Last Cologuard or Colonoscopy:  He has never had a colonoscopy before.  Notes in 2012 or 2013, he "had an event with hemorrhoids," which he had never had before and has never had since.  Notes he went to a doctor for assessment and treatment at the time.  Family history of Colon CA:  None reported.  Tobacco History Reviewed:  Former smoker.  Smoked 0.5 ppd for 15 pack-years.  Alcohol / drug use:  No concerns, no excessive use / no use. Exercise Habits:  Not meeting AHA guidelines. Dental Home:  Follows up about once yearly. Eye exams:  He does not have an eye doctor. Dermatology home:  Notes he has not seen a dermatologist for "many many many years."  Male history: STD concerns:  None, monogamous. Birth control method:   N/a. Additional penile/ urinary concerns:  None reported.  Feels everything works the way he wants it to work physically, but that his drive has decreased from how it used to be around [age].  Says "now at age 30, I constantly have multiple projects and things that have to get done, and sexuality is just not on the spectrum."  Says he almost feels  like his "brain chemistry has changed."  Notes that he and his wife often essentially have to make an appointment to be intimate.  Denies GI concerns today.  Denies urinary frequency or other urinary concerns.  Feels as he's gotten older, the only change with his urine has been with his stream/flow of urine.   Believes his weight "bounces," but stays about the same.  He "bloats up" in the winter and loses some weight again in the warmer weather.  He typically ranges between 218-225 lbs, for "I can't tell you how long."  Says if he goes a pound above 225 lbs, everything starts to fit differently.  Notes he felt "absolutely awful" in the past when he was over 230 lbs.  Additional concerns beyond Health Maintenance issues:     Overall he is doing well; notes his family is doing well.  Feels they are finally starting to put the stress of COVID-19 behind them.  He's been experiencing worsened arthritis recently.  Notes he always has a little bit of peeling skin between a couple of his toes.  Says it's typically "from getting wet," and he's wearing boots all day at work.    Immunization History  Administered Date(s) Administered  . Tdap 12/15/2012     Health Maintenance  Topic Date Due  . COLONOSCOPY  Never done  . INFLUENZA VACCINE  06/17/2019 (Originally 10/18/2018)  . TETANUS/TDAP  12/16/2022  . HIV Screening  Completed       Wt Readings from Last 3 Encounters:  06/10/19 220 lb 8 oz (100 kg)  03/17/19 220 lb (99.8 kg)  01/16/18 227 lb (103 kg)   BP Readings from Last 3 Encounters:  06/10/19 118/78  01/16/18 114/79  05/22/17 127/85   Pulse Readings from Last 3 Encounters:  06/10/19 74  01/16/18 71  05/22/17 73    Patient Active Problem List   Diagnosis Date Noted  . Hypertriglyceridemia 10/08/2015  . HLD (hyperlipidemia) 08/29/2015  . Prediabetes 08/29/2015  . Hypothyroidism 08/29/2015  . Mood disorder ([place]) 05/22/2017  . Remote History of cocaine use- for about 10  yrs 09/04/2016  . Family history of coronary artery disease- Annamarie Major- AMI early 17's; Dad- stents in early 60's) 09/04/2016  . History of smoking 09/05/2015  . GERD (gastroesophageal reflux disease) 08/29/2015  . Irritable bowel syndrome with constipation 08/12/2015  . Acute gross stress reaction / anxiety 08/12/2015  . Family history of prostate cancer in father- 86 yo at onset 09/04/2016  . Adrenal adenoma, left 09/04/2016  . Fatigue 09/13/2015  . Vitamin D deficiency 08/29/2015  . Environmental and seasonal allergies 02/20/2011  . Hx of medication noncompliance 03/17/2019  . Excessive drinking alcohol-  situational  05/22/2017  . Blackout spell 05/22/2017  . Low testosterone in male 05/22/2017  . Adjustment disorder with mixed anxiety and depressed mood 09/13/2016  . Colon cancer screening 09/04/2016  . Overweight (BMI 25.0-29.9) 09/04/2016  . Paronychia of finger of right hand 09/08/2015  . Cough 08/09/2015  . Abnormal CXR 08/09/2015  . ETD (eustachian tube dysfunction) 08/09/2015    Past Medical History:  Diagnosis Date  . Allergy   . Anxiety   . Fatigue    discomfort from hemorrhoids leading to lack of sleep and fatigue   . GERD (gastroesophageal reflux disease)   . Substance abuse ([place])   . Thyroid disease    hypothyroid    Past Surgical History:  Procedure Laterality Date  . HAND SURGERY  1984  . SKIN SURGERY  1984   skin graph surgery on left hand     Family History  Problem Relation Age of Onset  . Hypertension Mother   . Heart murmur Mother   . Kidney disease Father        stones  . Heart disease Father   . Hyperlipidemia Father   . Cancer Maternal Grandmother   . Heart disease Maternal Grandfather   . Heart disease Paternal Grandmother   . Heart disease Paternal Grandfather     Social History   Substance and Sexual Activity  Drug Use No  ,  Social History   Substance and Sexual Activity  Alcohol Use Yes  . Alcohol/week: 3.0 standard  drinks  . Types: 3 Standard drinks or equivalent per week  ,  Social History   Tobacco Use  Smoking Status Former Smoker  . Packs/day: 0.50  . Years: 30.00  . Pack years: 15.00  Smokeless Tobacco Former Systems developer  ,  Social History   Substance and Sexual Activity  Sexual Activity Yes    Patient's Medications  New Prescriptions  No medications on file  Previous Medications   ATORVASTATIN (LIPITOR) 40 MG TABLET    Take 1 tablet (40 mg total) by mouth daily.   CHOLECALCIFEROL (VITAMIN D) 1000 UNITS TABLET    Take 5,000 Units by mouth daily.   LEVOTHYROXINE (SYNTHROID) 175 MCG TABLET    Take 1 tablet (175 mcg total) by mouth every other day. Alternate with 230mcg tablets.   LEVOTHYROXINE (SYNTHROID) 200 MCG TABLET    Take 1 tablet (200 mcg total) by mouth every other day. Alternate with 157mcg tablets.   OMEGA-3 FATTY ACIDS (FISH OIL) 1000 MG CPDR    Take 4 capsules by mouth daily.   VITAMIN D, ERGOCALCIFEROL, (DRISDOL) 1.25 MG (50000 UT) CAPS CAPSULE    TAKE 1 CAPSULE BY MOUTH EVERY 7 DAYS.  Modified Medications   No medications on file  Discontinued Medications   No medications on file    Patient has no known allergies.  Review of Systems: General:   Denies fever, chills, unexplained weight loss.  Optho/Auditory:   Denies visual changes, blurred vision/LOV Respiratory:   Denies SOB, DOE more than baseline levels.   Cardiovascular:   Denies chest pain, palpitations, new onset peripheral edema  Gastrointestinal:   Denies nausea, vomiting, diarrhea.  Genitourinary: Denies dysuria, freq/ urgency, flank pain or discharge from genitals.  Endocrine:     Denies hot or cold intolerance, polyuria, polydipsia. Musculoskeletal:   Denies unexplained myalgias, joint swelling, unexplained arthralgias, gait problems.  Skin:  Denies rash, suspicious lesions Neurological:     Denies dizziness, unexplained weakness, numbness  Psychiatric/Behavioral:   Denies mood changes, suicidal or homicidal  ideations, hallucinations    Objective:     Blood pressure 118/78, pulse 74, temperature 98.1 F (36.7 C), temperature source Oral, resp. rate 10, height 6\' 3"  (1.905 m), weight 220 lb 8 oz (100 kg), SpO2 98 %. Body mass index is 27.56 kg/m. General Appearance:    Alert, cooperative, no distress, appears stated age  Head:    Normocephalic, without obvious abnormality, atraumatic  Eyes:    PERRL, conjunctiva/corneas clear, EOM's intact, fundi    benign, both eyes  Ears:    Normal TM's and external ear canals, both ears  Nose:   Nares normal, septum midline, mucosa normal, no drainage    or sinus tenderness  Throat:   Lips w/o lesion, mucosa moist, and tongue normal; teeth and   gums normal  Neck:   Supple, symmetrical, trachea midline, no adenopathy;    thyroid:  no enlargement/tenderness/nodules; no carotid   bruit or JVD  Back:     Symmetric, no curvature, ROM normal, no CVA tenderness  Lungs:     Clear to auscultation bilaterally, respirations unlabored, no       Wh/ R/ R  Chest Wall:    No tenderness or gross deformity; normal excursion   Heart:    Regular rate and rhythm, S1 and S2 normal, no murmur, rub   or gallop  Abdomen:     Soft, non-tender, bowel sounds active all four quadrants, NO   G/R/R, no masses, no organomegaly  Genitalia:    Ext genitalia: without lesion, no penile rash or discharge, no hernias appreciated   Rectal:    Normal tone, prostate WNL's and equal b/l, no tenderness; guaiac negative stool  Extremities:   Extremities normal, atraumatic, no cyanosis or gross edema  Pulses:   2+ and symmetric all extremities  Skin:   Warm, dry, Skin color, texture, turgor normal, no obvious rashes  or lesions  M-Sk:   Ambulates * 4 w/o difficulty, no gross deformities, tone WNL  Neurologic:   CNII-XII intact, normal strength, sensation and reflexes    Throughout Psych:  No HI/SI, judgement and insight good, Euthymic mood. Full Affect.

## 2019-06-11 LAB — T4, FREE: Free T4: 1.23 ng/dL (ref 0.82–1.77)

## 2019-06-11 LAB — COMPREHENSIVE METABOLIC PANEL
ALT: 25 IU/L (ref 0–44)
AST: 21 IU/L (ref 0–40)
Albumin/Globulin Ratio: 2.2 (ref 1.2–2.2)
Albumin: 4.7 g/dL (ref 3.8–4.9)
Alkaline Phosphatase: 51 IU/L (ref 39–117)
BUN/Creatinine Ratio: 13 (ref 9–20)
BUN: 15 mg/dL (ref 6–24)
Bilirubin Total: 0.9 mg/dL (ref 0.0–1.2)
CO2: 22 mmol/L (ref 20–29)
Calcium: 9.7 mg/dL (ref 8.7–10.2)
Chloride: 103 mmol/L (ref 96–106)
Creatinine, Ser: 1.19 mg/dL (ref 0.76–1.27)
GFR calc Af Amer: 80 mL/min/{1.73_m2} (ref >59)
GFR calc non Af Amer: 69 mL/min/{1.73_m2} (ref >59)
Globulin, Total: 2.1 g/dL (ref 1.5–4.5)
Glucose: 91 mg/dL (ref 65–99)
Potassium: 4.3 mmol/L (ref 3.5–5.2)
Sodium: 140 mmol/L (ref 134–144)
Total Protein: 6.8 g/dL (ref 6.0–8.5)

## 2019-06-11 LAB — CBC
Hematocrit: 42.6 % (ref 37.5–51.0)
Hemoglobin: 15.3 g/dL (ref 13.0–17.7)
MCH: 31.6 pg (ref 26.6–33.0)
MCHC: 35.9 g/dL — ABNORMAL HIGH (ref 31.5–35.7)
MCV: 88 fL (ref 79–97)
Platelets: 213 10*3/uL (ref 150–450)
RBC: 4.84 x10E6/uL (ref 4.14–5.80)
RDW: 13 % (ref 11.6–15.4)
WBC: 4.8 10*3/uL (ref 3.4–10.8)

## 2019-06-11 LAB — VITAMIN D 25 HYDROXY (VIT D DEFICIENCY, FRACTURES): Vit D, 25-Hydroxy: 54.6 ng/mL (ref 30.0–100.0)

## 2019-06-11 LAB — LIPID PANEL
Chol/HDL Ratio: 3.3 ratio (ref 0.0–5.0)
Cholesterol, Total: 176 mg/dL (ref 100–199)
HDL: 53 mg/dL (ref >39)
LDL Chol Calc (NIH): 87 mg/dL (ref 0–99)
Triglycerides: 214 mg/dL — ABNORMAL HIGH (ref 0–149)
VLDL Cholesterol Cal: 36 mg/dL (ref 5–40)

## 2019-06-11 LAB — TSH: TSH: 5.44 u[IU]/mL — ABNORMAL HIGH (ref 0.450–4.500)

## 2019-06-11 LAB — HEMOGLOBIN A1C
Est. average glucose Bld gHb Est-mCnc: 105 mg/dL
Hgb A1c MFr Bld: 5.3 % (ref 4.8–5.6)

## 2019-07-07 ENCOUNTER — Other Ambulatory Visit: Payer: Self-pay | Admitting: Family Medicine

## 2019-07-07 DIAGNOSIS — E559 Vitamin D deficiency, unspecified: Secondary | ICD-10-CM

## 2019-07-11 ENCOUNTER — Emergency Department (HOSPITAL_COMMUNITY)
Admission: EM | Admit: 2019-07-11 | Discharge: 2019-07-11 | Disposition: A | Discharge status: Home / Self Care | Payer: 59 | Attending: Emergency Medicine | Admitting: Emergency Medicine

## 2019-07-11 ENCOUNTER — Emergency Department (HOSPITAL_COMMUNITY): Payer: 59

## 2019-07-11 ENCOUNTER — Other Ambulatory Visit: Payer: Self-pay

## 2019-07-11 ENCOUNTER — Encounter (HOSPITAL_COMMUNITY): Payer: Self-pay | Admitting: Emergency Medicine

## 2019-07-11 DIAGNOSIS — R5383 Other fatigue: Secondary | ICD-10-CM | POA: Diagnosis not present

## 2019-07-11 DIAGNOSIS — R55 Syncope and collapse: Secondary | ICD-10-CM | POA: Diagnosis present

## 2019-07-11 DIAGNOSIS — R0789 Other chest pain: Secondary | ICD-10-CM | POA: Diagnosis not present

## 2019-07-11 DIAGNOSIS — Z87891 Personal history of nicotine dependence: Secondary | ICD-10-CM | POA: Diagnosis not present

## 2019-07-11 DIAGNOSIS — Z79899 Other long term (current) drug therapy: Secondary | ICD-10-CM | POA: Insufficient documentation

## 2019-07-11 LAB — COMPREHENSIVE METABOLIC PANEL
ALT: 47 U/L — ABNORMAL HIGH (ref 0–44)
AST: 33 U/L (ref 15–41)
Albumin: 4.1 g/dL (ref 3.5–5.0)
Alkaline Phosphatase: 41 U/L (ref 38–126)
Anion gap: 9 (ref 5–15)
BUN: 13 mg/dL (ref 6–20)
CO2: 26 mmol/L (ref 22–32)
Calcium: 9.7 mg/dL (ref 8.9–10.3)
Chloride: 104 mmol/L (ref 98–111)
Creatinine, Ser: 1.12 mg/dL (ref 0.61–1.24)
GFR calc Af Amer: >60 mL/min (ref >60)
GFR calc non Af Amer: >60 mL/min (ref >60)
Glucose, Bld: 101 mg/dL — ABNORMAL HIGH (ref 70–99)
Potassium: 4.5 mmol/L (ref 3.5–5.1)
Sodium: 139 mmol/L (ref 135–145)
Total Bilirubin: 0.9 mg/dL (ref 0.3–1.2)
Total Protein: 7 g/dL (ref 6.5–8.1)

## 2019-07-11 LAB — CBC
HCT: 44.7 % (ref 39.0–52.0)
Hemoglobin: 14.9 g/dL (ref 13.0–17.0)
MCH: 30.3 pg (ref 26.0–34.0)
MCHC: 33.3 g/dL (ref 30.0–36.0)
MCV: 91 fL (ref 80.0–100.0)
Platelets: 215 10*3/uL (ref 150–400)
RBC: 4.91 MIL/uL (ref 4.22–5.81)
RDW: 12.5 % (ref 11.5–15.5)
WBC: 4.2 10*3/uL (ref 4.0–10.5)
nRBC: 0 % (ref 0.0–0.2)

## 2019-07-11 LAB — DIFFERENTIAL
Abs Immature Granulocytes: 0 10*3/uL (ref 0.00–0.07)
Basophils Absolute: 0 10*3/uL (ref 0.0–0.1)
Basophils Relative: 1 %
Eosinophils Absolute: 0.2 10*3/uL (ref 0.0–0.5)
Eosinophils Relative: 4 %
Immature Granulocytes: 0 %
Lymphocytes Relative: 29 %
Lymphs Abs: 1.2 10*3/uL (ref 0.7–4.0)
Monocytes Absolute: 0.3 10*3/uL (ref 0.1–1.0)
Monocytes Relative: 7 %
Neutro Abs: 2.5 10*3/uL (ref 1.7–7.7)
Neutrophils Relative %: 59 %

## 2019-07-11 LAB — PROTIME-INR
INR: 1 (ref 0.8–1.2)
Prothrombin Time: 12.9 seconds (ref 11.4–15.2)

## 2019-07-11 LAB — APTT: aPTT: 29 seconds (ref 24–36)

## 2019-07-11 LAB — CBG MONITORING, ED: Glucose-Capillary: 102 mg/dL — ABNORMAL HIGH (ref 70–99)

## 2019-07-11 MED ORDER — SODIUM CHLORIDE 0.9% FLUSH
3.0000 mL | Freq: Once | INTRAVENOUS | Status: DC
Start: 2019-07-11 — End: 2019-07-12

## 2019-07-11 NOTE — ED Provider Notes (Signed)
[facility] Provider Note   CSN: [id number] Arrival date & time: 07/11/19  1510     History Chief Complaint  Patient presents with  . Transient Ischemic Attack    Andrew Blair is a 54 y.o. male with history of thyroid disease and hyperlipidemia who presents with possible TIA.  Patient states that yesterday he went to work and came home and felt fatigued.  He ate some Brendolyn Patty which made him feel more tired and he wanted to take a nap but him and his wife had plans to go to a friend's house.  He was walking around and then he felt like his vision was changing like his "eyes weren't dilating down".  He stepped outside because he felt he was going to pass out. He felt flushed and his knees were weak and then he went to find his wife and she noted his speech was slurred and the right side of his face was drooping. He also felt like his chest was tight and his heart was racing. He sat down and he felt better. He got up, got some water, and took a shower and he felt totally back to normal. The episode lasted about 7 minutes. He never lost consciousness. He did not have a headache, dizziness, vision loss, unilateral extremity weakness, paresthesias. Today he feels overall better but just sore, shaky, weak like his body "went through something".  He denies fever, chills, cough, N/V/D. He talked to a friend who is a doctor and they advised him to come to the ED.  HPI     Past Medical History:  Diagnosis Date  . Allergy   . Anxiety   . Fatigue    discomfort from hemorrhoids leading to lack of sleep and fatigue   . GERD (gastroesophageal reflux disease)   . Substance abuse ([place])   . Thyroid disease    hypothyroid    Patient Active Problem List   Diagnosis Date Noted  . Hx of medication noncompliance 03/17/2019  . Mood disorder ([place]) 05/22/2017  . Excessive drinking alcohol-  situational  05/22/2017  . Blackout spell 05/22/2017  . Low  testosterone in male 05/22/2017  . Adjustment disorder with mixed anxiety and depressed mood 09/13/2016  . Remote History of cocaine use- for about 10 yrs 09/04/2016  . Family history of coronary artery disease- Annamarie Major- AMI early 76's; Dad- stents in early 60's) 09/04/2016  . Family history of prostate cancer in father- 27 yo at onset 09/04/2016  . Adrenal adenoma, left 09/04/2016  . Colon cancer screening 09/04/2016  . Overweight (BMI 25.0-29.9) 09/04/2016  . Hypertriglyceridemia 10/08/2015  . Fatigue 09/13/2015  . Paronychia of finger of right hand 09/08/2015  . History of smoking 09/05/2015  . HLD (hyperlipidemia) 08/29/2015  . Prediabetes 08/29/2015  . Vitamin D deficiency 08/29/2015  . Hypothyroidism 08/29/2015  . GERD (gastroesophageal reflux disease) 08/29/2015  . Irritable bowel syndrome with constipation 08/12/2015  . Acute gross stress reaction / anxiety 08/12/2015  . Cough 08/09/2015  . Abnormal CXR 08/09/2015  . ETD (eustachian tube dysfunction) 08/09/2015  . Environmental and seasonal allergies 02/20/2011    Past Surgical History:  Procedure Laterality Date  . HAND SURGERY  1984  . SKIN SURGERY  1984   skin graph surgery on left hand        Family History  Problem Relation Age of Onset  . Hypertension Mother   . Heart murmur Mother   . Kidney disease Father  stones  . Heart disease Father   . Hyperlipidemia Father   . Cancer Maternal Grandmother   . Heart disease Maternal Grandfather   . Heart disease Paternal Grandmother   . Heart disease Paternal Grandfather     Social History   Tobacco Use  . Smoking status: Former Smoker    Packs/day: 0.50    Years: 30.00    Pack years: 15.00  . Smokeless tobacco: Former Network engineer Use Topics  . Alcohol use: Yes    Alcohol/week: 3.0 standard drinks    Types: 3 Standard drinks or equivalent per week  . Drug use: No    Home Medications Prior to Admission medications   Medication Sig Start  Date End Date Taking? Authorizing Provider  atorvastatin (LIPITOR) 40 MG tablet Take 1 tablet (40 mg total) by mouth daily. 03/17/19   Mellody Dance, DO  cholecalciferol (VITAMIN D) 1000 units tablet Take 5,000 Units by mouth daily.    [provider]  levothyroxine (SYNTHROID) 175 MCG tablet Take 1 tablet (175 mcg total) by mouth every other day. Alternate with 266mcg tablets. 03/17/19   Mellody Dance, DO  levothyroxine (SYNTHROID) 200 MCG tablet Take 1 tablet (200 mcg total) by mouth every other day. Alternate with 152mcg tablets. 03/17/19   Opalski, Neoma Laming, DO  Omega-3 Fatty Acids (FISH OIL) 1000 MG CPDR Take 4 capsules by mouth daily.    [provider]  Vitamin D, Ergocalciferol, (DRISDOL) 1.25 MG (50000 UNIT) CAPS capsule TAKE 1 CAPSULE BY MOUTH EVERY 7 DAYS. 07/07/19   Mellody Dance, DO    Allergies    Patient has no known allergies.  Review of Systems   Review of Systems  Constitutional: Negative for chills and fever.  Respiratory: Positive for chest tightness (resolved). Negative for shortness of breath.   Cardiovascular: Positive for palpitations (resolved). Negative for chest pain.  Gastrointestinal: Negative for abdominal pain.  Neurological: Positive for facial asymmetry, speech difficulty and light-headedness. Negative for dizziness, tremors, seizures, syncope, weakness, numbness and headaches.  All other systems reviewed and are negative.   Physical Exam Updated Vital Signs BP (!) 124/97   Pulse (!) 56   Temp 98.1 F (36.7 C) (Oral)   Resp 18   Ht 6\' 2"  (1.88 m)   Wt 99.8 kg   SpO2 100%   BMI 28.25 kg/m   Physical Exam Vitals and nursing note reviewed.  Constitutional:      General: He is not in acute distress.    Appearance: Normal appearance. He is well-developed. He is not ill-appearing.     Comments: Alert, calm, cooperative. NAD  HENT:     Head: Normocephalic and atraumatic.  Eyes:     General: No scleral icterus.       Right  eye: No discharge.        Left eye: No discharge.     Conjunctiva/sclera: Conjunctivae normal.     Pupils: Pupils are equal, round, and reactive to light.  Cardiovascular:     Rate and Rhythm: Normal rate and regular rhythm.  Pulmonary:     Effort: Pulmonary effort is normal. No respiratory distress.     Breath sounds: Normal breath sounds.  Abdominal:     General: There is no distension.     Palpations: Abdomen is soft.     Tenderness: There is no abdominal tenderness.  Musculoskeletal:     Cervical back: Normal range of motion.  Skin:    General: Skin is warm and dry.  Neurological:  Mental Status: He is alert and oriented to person, place, and time.     Comments: Mental Status:  Alert, oriented, thought content appropriate, able to give a coherent history. Speech fluent without evidence of aphasia. Able to follow 2 step commands without difficulty.  Cranial Nerves:  II:  Peripheral visual fields grossly normal, pupils equal, round, reactive to light III,IV, VI: ptosis not present, extra-ocular motions intact bilaterally  V,VII: smile symmetric, facial light touch sensation equal VIII: hearing grossly normal to voice  X: uvula elevates symmetrically  XI: bilateral shoulder shrug symmetric and strong XII: midline tongue extension without fassiculations Motor:  Normal tone. 5/5 in upper and lower extremities bilaterally including strong and equal grip strength and dorsiflexion/plantar flexion Sensory: Pinprick and light touch normal in all extremities.  Cerebellar: normal finger-to-nose with bilateral upper extremities Gait: normal gait and balance CV: distal pulses palpable throughout    Psychiatric:        Behavior: Behavior normal.     ED Results / Procedures / Treatments   Labs (all labs ordered are listed, but only abnormal results are displayed) Labs Reviewed  COMPREHENSIVE METABOLIC PANEL - Abnormal; Notable for the following components:      Result Value    Glucose, Bld 101 (*)    ALT 47 (*)    All other components within normal limits  CBG MONITORING, ED - Abnormal; Notable for the following components:   Glucose-Capillary 102 (*)    All other components within normal limits  PROTIME-INR  APTT  CBC  DIFFERENTIAL    EKG EKG Interpretation  Date/Time:  Saturday July 11 2019 15:17:24 EDT Ventricular Rate:  73 PR Interval:  168 QRS Duration: 80 QT Interval:  366 QTC Calculation: 403 R Axis:   78 Text Interpretation: Normal sinus rhythm Normal ECG No previous ECGs available Confirmed by Wandra Arthurs [phone]) on 07/11/2019 9:53:40 PM   Radiology CT HEAD WO CONTRAST  Result Date: 07/11/2019 CLINICAL DATA:  Visual changes, lightheadedness, speech difficulty, symptoms transient EXAM: CT HEAD WITHOUT CONTRAST TECHNIQUE: Contiguous axial images were obtained from the base of the skull through the vertex without intravenous contrast. COMPARISON:  01/02/2007 FINDINGS: Brain: No acute infarct or hemorrhage. Lateral ventricles and midline structures are unremarkable. No acute extra-axial fluid collections. No mass effect. Vascular: No hyperdense vessel or unexpected calcification. Skull: Normal. Negative for fracture or focal lesion. Sinuses/Orbits: Minimal polypoid mucosal thickening within the ethmoid and maxillary sinuses. Other: None. IMPRESSION: 1. No acute intracranial process. Electronically Signed   By: Randa Ngo M.D.   On: 07/11/2019 16:46    Procedures Procedures (including critical care time)  Medications Ordered in ED Medications  sodium chloride flush (NS) 0.9 % injection 3 mL (has no administration in time range)    ED Course  I have reviewed the triage vital signs and the nursing notes.  Pertinent labs & imaging results that were available during my care of the patient were reviewed by me and considered in my medical decision making (see chart for details).  54 year old male presents with multiple symptoms that occurred  yesterday which sound more consistent with near syncopal episode rather than TIA.  Patient clearly states that he felt like he was going to pass out.  Here his blood pressure is minimally elevated at times but otherwise vital signs are reassuring.  Neurologic exam is grossly normal.  EKG is normal sinus rhythm.  Labs are unremarkable.  CT head was obtained in triage and is unremarkable.  Patient is  completely asymptomatic and I do not feel orthostatics or any further work-up would be indicated today.  Patient and his wife given reassurance and advised to have the blood pressure rechecked by your doctor at another time.  They verbalized understanding  MDM Rules/Calculators/A&P                       Final Clinical Impression(s) / ED Diagnoses Final diagnoses:  Near syncope    Rx / DC Orders ED Discharge Orders    None       Recardo Evangelist, PA-C 07/11/19 2250    Drenda Freeze, MD 07/11/19 2328

## 2019-07-11 NOTE — ED Triage Notes (Signed)
Pt reports he had 5-7 min episode yesterday of dilation to bilateral eyes, feeling flushed, unable to speak followed by slurred speech, chest tightness, and facial droop.    Denies any symptoms at this time.  No arm drift.

## 2019-07-11 NOTE — ED Notes (Signed)
Patient verbalizes understanding of discharge instructions. Opportunity for questioning and answers were provided. Armband removed by staff, pt discharged from ED. Pt. ambulatory and discharged home.  

## 2019-07-11 NOTE — Discharge Instructions (Signed)
Please drink plenty of fluids Have you blood pressure rechecked by your doctor in the next week Return if you are worsening

## 2019-08-12 ENCOUNTER — Encounter: Payer: Self-pay | Admitting: Family Medicine

## 2019-09-01 ENCOUNTER — Other Ambulatory Visit: Payer: Self-pay | Admitting: Physician Assistant

## 2019-09-01 DIAGNOSIS — E781 Pure hyperglyceridemia: Secondary | ICD-10-CM

## 2019-09-01 DIAGNOSIS — E079 Disorder of thyroid, unspecified: Secondary | ICD-10-CM

## 2019-09-01 DIAGNOSIS — E782 Mixed hyperlipidemia: Secondary | ICD-10-CM

## 2019-09-01 MED ORDER — LEVOTHYROXINE SODIUM 200 MCG PO TABS: 200.0000 ug | ORAL_TABLET | ORAL | 0 refills | Status: DC

## 2019-09-01 MED ORDER — ATORVASTATIN CALCIUM 40 MG PO TABS: 40.0000 mg | ORAL_TABLET | Freq: Every day | ORAL | 0 refills | Status: DC

## 2019-09-17 ENCOUNTER — Other Ambulatory Visit: Payer: Self-pay | Admitting: Family Medicine

## 2019-09-17 DIAGNOSIS — E079 Disorder of thyroid, unspecified: Secondary | ICD-10-CM

## 2020-01-22 ENCOUNTER — Other Ambulatory Visit: Payer: Self-pay | Admitting: Physician Assistant

## 2020-01-22 DIAGNOSIS — E079 Disorder of thyroid, unspecified: Secondary | ICD-10-CM

## 2020-03-14 ENCOUNTER — Other Ambulatory Visit: Payer: 59

## 2020-03-14 ENCOUNTER — Other Ambulatory Visit: Payer: Self-pay

## 2020-03-14 DIAGNOSIS — E079 Disorder of thyroid, unspecified: Secondary | ICD-10-CM

## 2020-03-14 DIAGNOSIS — E782 Mixed hyperlipidemia: Secondary | ICD-10-CM

## 2020-03-14 NOTE — Progress Notes (Signed)
Lab only 

## 2020-03-15 ENCOUNTER — Other Ambulatory Visit: Payer: 59

## 2020-03-15 ENCOUNTER — Telehealth: Payer: Self-pay | Admitting: Physician Assistant

## 2020-03-15 DIAGNOSIS — E079 Disorder of thyroid, unspecified: Secondary | ICD-10-CM

## 2020-03-15 MED ORDER — LEVOTHYROXINE SODIUM 200 MCG PO TABS: 200.0000 ug | ORAL_TABLET | ORAL | 0 refills | Status: DC

## 2020-03-15 NOTE — Telephone Encounter (Signed)
Please see mychart message for details. AS, CMA

## 2020-04-20 ENCOUNTER — Ambulatory Visit
Admission: EM | Admit: 2020-04-20 | Discharge: 2020-04-20 | Disposition: A | Discharge status: Home / Self Care | Payer: 59 | Attending: Urgent Care | Admitting: Urgent Care

## 2020-04-20 ENCOUNTER — Other Ambulatory Visit: Payer: Self-pay

## 2020-04-20 ENCOUNTER — Telehealth: Payer: Self-pay | Admitting: Physician Assistant

## 2020-04-20 DIAGNOSIS — R0789 Other chest pain: Secondary | ICD-10-CM | POA: Diagnosis not present

## 2020-04-20 DIAGNOSIS — R059 Cough, unspecified: Secondary | ICD-10-CM

## 2020-04-20 DIAGNOSIS — R0981 Nasal congestion: Secondary | ICD-10-CM

## 2020-04-20 DIAGNOSIS — U071 COVID-19: Secondary | ICD-10-CM

## 2020-04-20 MED ORDER — LEVOTHYROXINE SODIUM 175 MCG PO TABS: 175.0000 ug | ORAL_TABLET | Freq: Every day | ORAL | 0 refills | Status: DC

## 2020-04-20 MED ORDER — BENZONATATE 100 MG PO CAPS: 100.0000 mg | ORAL_CAPSULE | Freq: Three times a day (TID) | ORAL | 0 refills | Status: DC | PRN

## 2020-04-20 MED ORDER — PREDNISONE 20 MG PO TABS
ORAL_TABLET | ORAL | 0 refills | Status: DC
Start: 2020-04-20 — End: 2020-07-11

## 2020-04-20 MED ORDER — PROMETHAZINE-DM 6.25-15 MG/5ML PO SYRP
5.0000 mL | ORAL_SOLUTION | Freq: Every evening | ORAL | 0 refills | Status: DC | PRN
Start: 2020-04-20 — End: 2022-07-06

## 2020-04-20 NOTE — Telephone Encounter (Signed)
Patient states he has a lot of congestion in his chest and some dizziness with exertion. Patient advised he should go to UC for eval and treatment since we are unable to see pt until Monday. Patient verbalized understanding and was agreeable. AS, CMA

## 2020-04-20 NOTE — ED Provider Notes (Signed)
[facility]   MRN: [id number] DOB: 1965/10/20  Subjective:   Andrew Blair is a 55 y.o. male presenting for 2 week history of persistent cough, chest tightness, malaise and fatigue. Has been improving but wanted to make sure he was evaluated.  He did test positive for COVID-19 about 10 days ago.  He had another couple of tests that were negative.  He is very worried about pneumonia as he had a severe bout a few years ago.  He is working on quitting smoking, currently uses nicotine patches.  No current facility-administered medications for this encounter.  Current Outpatient Medications:  .  atorvastatin (LIPITOR) 40 MG tablet, Take 1 tablet (40 mg total) by mouth daily., Disp: 90 tablet, Rfl: 0 .  Cholecalciferol (VITAMIN D3) 125 MCG (5000 UT) CAPS, Take 5,000 Units by mouth See admin instructions. Take 5,000 units by mouth once a day on Mon/Tues/Wed/Thurs/Fri/Sat, Disp: , Rfl:  .  levothyroxine (SYNTHROID) 200 MCG tablet, Take 1 tablet (200 mcg total) by mouth every other day. Alternate with 153mcg tablets., Disp: 30 tablet, Rfl: 0 .  Magnesium 250 MG TABS, Take 250 mg by mouth daily., Disp: , Rfl:  .  Vitamin D, Ergocalciferol, (DRISDOL) 1.25 MG (50000 UNIT) CAPS capsule, TAKE 1 CAPSULE BY MOUTH EVERY 7 DAYS. (Patient taking differently: Take 50,000 Units by mouth every Sunday.), Disp: 12 capsule, Rfl: 10 .  levothyroxine (SYNTHROID) 175 MCG tablet, Take 1 tablet (175 mcg total) by mouth every other day. Alternate with 258mcg tablets. (Patient taking differently: Take 175 mcg by mouth every other day.), Disp: 45 tablet, Rfl: 0 .  Omega-3 Fatty Acids (FISH OIL PO), Take 2 capsules by mouth daily., Disp: , Rfl:  .  TURMERIC PO, Take 1 capsule by mouth daily., Disp: , Rfl:    No Known Allergies  Past Medical History:  Diagnosis Date  . Allergy   . Anxiety   . Fatigue    discomfort from hemorrhoids leading to lack of sleep and fatigue   . GERD (gastroesophageal reflux  disease)   . Substance abuse ([place])   . Thyroid disease    hypothyroid     Past Surgical History:  Procedure Laterality Date  . HAND SURGERY  1984  . SKIN SURGERY  1984   skin graph surgery on left hand     Family History  Problem Relation Age of Onset  . Hypertension Mother   . Heart murmur Mother   . Kidney disease Father        stones  . Heart disease Father   . Hyperlipidemia Father   . Cancer Maternal Grandmother   . Heart disease Maternal Grandfather   . Heart disease Paternal Grandmother   . Heart disease Paternal Grandfather     Social History   Tobacco Use  . Smoking status: Former Smoker    Packs/day: 0.50    Years: 30.00    Pack years: 15.00  . Smokeless tobacco: Former Network engineer  . Vaping Use: Some days  Substance Use Topics  . Alcohol use: Yes    Alcohol/week: 3.0 standard drinks    Types: 3 Standard drinks or equivalent per week  . Drug use: No    ROS   Objective:   Vitals: BP 129/79 (BP Location: Left Arm)   Temp 98.4 F (36.9 C) (Oral)   Resp 18   SpO2 97%   Pulse 83bpm.   Physical Exam Constitutional:      General: He is not  in acute distress.    Appearance: Normal appearance. He is well-developed. He is not ill-appearing, toxic-appearing or diaphoretic.  HENT:     Head: Normocephalic and atraumatic.     Right Ear: External ear normal.     Left Ear: External ear normal.     Nose: Nose normal.     Mouth/Throat:     Mouth: Mucous membranes are moist.     Pharynx: Oropharynx is clear.  Eyes:     General: No scleral icterus.    Extraocular Movements: Extraocular movements intact.     Pupils: Pupils are equal, round, and reactive to light.  Cardiovascular:     Rate and Rhythm: Normal rate and regular rhythm.     Heart sounds: Normal heart sounds. No murmur heard. No friction rub. No gallop.   Pulmonary:     Effort: Pulmonary effort is normal. No respiratory distress.     Breath sounds: Normal breath sounds. No stridor.  No wheezing, rhonchi or rales.  Neurological:     Mental Status: He is alert and oriented to person, place, and time.  Psychiatric:        Mood and Affect: Mood normal.        Behavior: Behavior normal.        Thought Content: Thought content normal.      Assessment and Plan :   PDMP not reviewed this encounter.  1. Chest tightness   2. Sinus congestion   3. Cough   4. COVID-19     Will use prednisone course, cough suppression medications. Patient is still working on quitting smoking. Encouraged continued efforts.  Use supportive care otherwise.  Offered a chest x-ray but patient declined.  Requested refill of his levothyroxine.  We will do this at 175 mcg daily.  Follow-up with PCP.  Counseled patient on potential for adverse effects with medications prescribed/recommended today, ER and return-to-clinic precautions discussed, patient verbalized understanding.    Jaynee Eagles, PA-C 04/20/20 1256

## 2020-04-20 NOTE — Telephone Encounter (Signed)
Patient is recovered from [place] and has a lingering respiratory issue. He cannot seem to get back to where he was. He is coughing and has very loose fluid as he is coughing, he says it is not mucus more fluid like. He also needs his thyroid labs and has not been seen by [facility]. He had labs scheduled for December but cancelled and then was a no show for the second lab. He was last seen by Dr. Jenetta Downer for a physical. He is worried pneumonemia  setting in as he has had that before. When he is moving around he gets a little light headed. A good call back number is [phone]. Thanks

## 2020-04-20 NOTE — ED Triage Notes (Signed)
Patient states he has had a positive covid test several weeks ago and is still having congestion and chest tightness with deep breaths. Pt is aox4 and ambulatory.

## 2020-07-07 ENCOUNTER — Other Ambulatory Visit: Payer: Self-pay

## 2020-07-07 ENCOUNTER — Other Ambulatory Visit: Payer: 59

## 2020-07-11 ENCOUNTER — Encounter: Payer: Self-pay | Admitting: Nurse Practitioner

## 2020-07-11 ENCOUNTER — Other Ambulatory Visit: Payer: Self-pay

## 2020-07-11 ENCOUNTER — Ambulatory Visit: Payer: 59 | Admitting: Nurse Practitioner

## 2020-07-11 VITALS — BP 116/78 | HR 71 | Temp 98.8°F | Ht 75.0 in | Wt 215.7 lb

## 2020-07-11 DIAGNOSIS — Z1211 Encounter for screening for malignant neoplasm of colon: Secondary | ICD-10-CM

## 2020-07-11 DIAGNOSIS — E782 Mixed hyperlipidemia: Secondary | ICD-10-CM

## 2020-07-11 DIAGNOSIS — E079 Disorder of thyroid, unspecified: Secondary | ICD-10-CM

## 2020-07-11 DIAGNOSIS — R1319 Other dysphagia: Secondary | ICD-10-CM | POA: Insufficient documentation

## 2020-07-11 DIAGNOSIS — E559 Vitamin D deficiency, unspecified: Secondary | ICD-10-CM | POA: Diagnosis not present

## 2020-07-11 NOTE — Patient Instructions (Signed)
https://www.cancer.org/cancer/colon-rectal-cancer/causes-risks-prevention/risk-factors.html">  Colorectal Cancer Screening  Colorectal cancer screening is a group of tests that are used to check for colorectal cancer before symptoms develop. Colorectal refers to the colon and rectum. The colon and rectum are located at the end of the digestive tract and carry stool (feces) out of the body. Who should have screening? All adults who are 45-54 years old should have screening. Your health care provider may recommend screening before age 45. You will have tests every 1-10 years, depending on your results and the type of screening test. Screening recommendations for adults who are 76-85 years old vary depending on a person's health. People older than age 85 should no longer get colorectal cancer screening. You may have screening tests starting before age 45, or more often than other people, if you have any of these risk factors:  A personal or family history of colorectal cancer or abnormal growths known as polyps in your colon.  Inflammatory bowel disease, such as ulcerative colitis or Crohn's disease.  A history of having radiation treatment to the abdomen or the area between the hip bones (pelvic area) for cancer.  A type of genetic syndrome that is passed from parent to child (hereditary), such as: ? Lynch syndrome. ? Familial adenomatous polyposis. ? Turcot syndrome. ? Peutz-Jeghers syndrome. ? MUTYH-associated polyposis (MAP).  A personal history of diabetes. Types of tests There are several types of colorectal screening tests. You may have one or more of the following:  Guaiac-based fecal occult blood testing. For this test, a stool sample is checked for hidden (occult) blood, which could be a sign of colorectal cancer.  Fecal immunochemical test (FIT). For this test, a stool sample is checked for blood, which could be a sign of colorectal cancer.  Stool DNA test. For this test, a stool  sample is checked for blood and changes in DNA that could lead to colorectal cancer.  Sigmoidoscopy. During this test, a thin, flexible tube with a camera on the end, called a sigmoidoscope, is used to examine the rectum and the lower colon.  Colonoscopy. During this test, a long, flexible tube with a camera on the end, called a colonoscope, is used to examine the entire colon and rectum. Also, sometimes a tissue sample is taken to be looked at under a microscope (biopsy) or small polyps are removed during this test.  Virtual colonoscopy. Instead of a colonoscope, this type of colonoscopy uses a CT scan to take pictures of the colon and rectum. A CT scan is a type of X-ray that is made using computers. What are the benefits of screening? Screening reduces your risk for colorectal cancer and can help identify cancer at an early stage, when the cancer can be removed or treated more easily. It is common for polyps to form in the lining of the colon, especially as you age. These polyps may be cancerous or become cancerous over time. Screening can identify these polyps. What are the risks of screening? Generally, these are safe tests. However, problems may occur, including:  The need for more tests to confirm results from a stool sample test. Stool sample tests have fewer risks than other types of screening tests.  Being exposed to low levels of radiation, if you had a test involving X-rays. This may slightly increase your cancer risk. The benefit of detecting cancer outweighs the slight increase in risk.  Bleeding, damage to the intestine, or infection caused by a sigmoidoscopy or colonoscopy.  A reaction to medicines given   during a sigmoidoscopy or colonoscopy. Talk with your health care provider to understand your risk for colorectal cancer and to make a screening plan that is right for you. Questions to ask your health care provider  When should I start colorectal cancer screening?  What is my  risk for colorectal cancer?  How often do I need screening?  Which screening tests do I need?  How do I get my test results?  What do my results mean? Where to find more information Learn more about colorectal cancer screening from:  The American Cancer Society: cancer.org  National Cancer Institute: cancer.gov Summary  Colorectal cancer screening is a group of tests used to check for colorectal cancer before symptoms develop.  All adults who are 45-54 years old should have screening. Your health care provider may recommend screening before age 45.  You may have screening tests starting before age 45, or more often than other people, if you have certain risk factors.  Screening reduces your risk for colorectal cancer and can help identify cancer at an early stage, when the cancer can be removed or treated more easily.  Talk with your health care provider to understand your risk for colorectal cancer and to make a screening plan that is right for you. This information is not intended to replace advice given to you by your health care provider. Make sure you discuss any questions you have with your health care provider. Document Revised: 06/24/2019 Document Reviewed: 06/24/2019 Elsevier Patient Education  2021 [employer].  

## 2020-07-11 NOTE — Progress Notes (Signed)
Established Patient Office Visit  Subjective:  Patient ID: Andrew Blair, male    DOB: 1965-08-06  Age: 55 y.o. MRN: [id number]  CC:  Chief Complaint  Patient presents with  . Medication Refill    HPI Andrew Blair presents for follow up visit. Has history of hypothyroid and hyperlipidemia. Has been out of atorvastatin for a few months. Was unable to get in for appointment for labs or follow up for some time. He did have COVID 19 in January. Was likely he developed pneumonia as a result. Took some time for him to clear infection and feel normal. He was treated once in Urgent Care for his symptoms. Urgent care filled his levothyroxine at that visit, so patient does have prescription for this. Has about a week left.  Does need to have fasting blood work done to check thyroid panel, lipid panel, and vitamin d.  He states that he has noted some trouble swallowing. States that food will get hung up in the middle of his esophagus. Hard to get down all the way. He also thinks he has intermittent gas as a result of possible narrowing of the esophagus. He states that he does have a family history of this. Has never had screening colonoscopy or endoscopy.  Patient denies chest pain, chest pressure, or shortness of breath. He denies headache, or visual disturbance. He denies changes in bowel or bladder habits.   Past Medical History:  Diagnosis Date  . Allergy   . Anxiety   . Fatigue    discomfort from hemorrhoids leading to lack of sleep and fatigue   . GERD (gastroesophageal reflux disease)   . Substance abuse ([place])   . Thyroid disease    hypothyroid    Past Surgical History:  Procedure Laterality Date  . HAND SURGERY  1984  . SKIN SURGERY  1984   skin graph surgery on left hand     Family History  Problem Relation Age of Onset  . Hypertension Mother   . Heart murmur Mother   . Kidney disease Father        stones  . Heart disease Father   . Hyperlipidemia Father   .  Cancer Maternal Grandmother   . Heart disease Maternal Grandfather   . Heart disease Paternal Grandmother   . Heart disease Paternal Grandfather     Social History   Socioeconomic History  . Marital status: Married    Spouse name: Not on file  . Number of children: Not on file  . Years of education: Not on file  . Highest education level: Not on file  Occupational History  . Not on file  Tobacco Use  . Smoking status: Former Smoker    Packs/day: 0.50    Years: 30.00    Pack years: 15.00  . Smokeless tobacco: Former Network engineer  . Vaping Use: Some days  Substance and Sexual Activity  . Alcohol use: Yes    Alcohol/week: 3.0 standard drinks    Types: 3 Standard drinks or equivalent per week  . Drug use: No  . Sexual activity: Yes  Other Topics Concern  . Not on file  Social History Narrative   E_Cigarette   Social Determinants of Health   Financial Resource Strain: Not on file  Food Insecurity: Not on file  Transportation Needs: Not on file  Physical Activity: Not on file  Stress: Not on file  Social Connections: Not on file  Intimate Partner Violence: Not on file  Outpatient Medications Prior to Visit  Medication Sig Dispense Refill  . atorvastatin (LIPITOR) 40 MG tablet Take 1 tablet (40 mg total) by mouth daily. 90 tablet 0  . Cholecalciferol (VITAMIN D3) 125 MCG (5000 UT) CAPS Take 5,000 Units by mouth See admin instructions. Take 5,000 units by mouth once a day on Mon/Tues/Wed/Thurs/Fri/Sat    . levothyroxine (SYNTHROID) 175 MCG tablet Take 1 tablet (175 mcg total) by mouth daily before breakfast. 90 tablet 0  . Magnesium 250 MG TABS Take 250 mg by mouth daily.    . Omega-3 Fatty Acids (FISH OIL PO) Take 2 capsules by mouth daily.    . promethazine-dextromethorphan (PROMETHAZINE-DM) 6.25-15 MG/5ML syrup Take 5 mLs by mouth at bedtime as needed for cough. 100 mL 0  . TURMERIC PO Take 1 capsule by mouth daily.    . Vitamin D, Ergocalciferol, (DRISDOL)  1.25 MG (50000 UNIT) CAPS capsule TAKE 1 CAPSULE BY MOUTH EVERY 7 DAYS. (Patient taking differently: Take 50,000 Units by mouth every Sunday.) 12 capsule 10  . benzonatate (TESSALON) 100 MG capsule Take 1-2 capsules (100-200 mg total) by mouth 3 (three) times daily as needed for cough. 60 capsule 0  . predniSONE (DELTASONE) 20 MG tablet Take 2 tablets daily with breakfast. 10 tablet 0   No facility-administered medications prior to visit.    No Known Allergies  ROS Review of Systems  Constitutional: Negative for activity change, chills and fever.  HENT: Positive for trouble swallowing. Negative for congestion and sinus pain.   Eyes: Negative.   Respiratory: Negative for cough, chest tightness, shortness of breath and wheezing.   Cardiovascular: Negative for chest pain and palpitations.  Gastrointestinal: Negative for constipation, diarrhea, nausea and vomiting.       Does have intermittent episodes of increased gas.   Endocrine: Negative for cold intolerance, heat intolerance, polydipsia and polyuria.       Due to have thyroid panel checked.   Genitourinary: Negative.   Musculoskeletal: Negative.  Negative for back pain and myalgias.  Skin: Negative.  Negative for rash.  Allergic/Immunologic: Negative for environmental allergies.  Neurological: Negative for dizziness, weakness and headaches.  Psychiatric/Behavioral: The patient is not nervous/anxious.   All other systems reviewed and are negative.     Objective:    Physical Exam Vitals and nursing note reviewed.  Constitutional:      Appearance: Normal appearance. He is well-developed.  HENT:     Head: Normocephalic.     Nose: Nose normal.  Eyes:     Pupils: Pupils are equal, round, and reactive to light.  Neck:     Vascular: No carotid bruit.  Cardiovascular:     Rate and Rhythm: Normal rate and regular rhythm.     Pulses: Normal pulses.     Heart sounds: Normal heart sounds.  Pulmonary:     Effort: Pulmonary effort  is normal.     Breath sounds: Normal breath sounds.  Abdominal:     Palpations: Abdomen is soft.  Musculoskeletal:        General: Normal range of motion.     Cervical back: Normal range of motion and neck supple.  Skin:    General: Skin is warm and dry.     Capillary Refill: Capillary refill takes less than 2 seconds.  Neurological:     General: No focal deficit present.     Mental Status: He is alert and oriented to person, place, and time.  Psychiatric:        Mood  and Affect: Mood normal.        Behavior: Behavior normal.        Thought Content: Thought content normal.        Judgment: Judgment normal.     Today's Vitals   07/11/20 1115  BP: 116/78  Pulse: 71  Temp: 98.8 F (37.1 C)  SpO2: 98%  Weight: 215 lb 11.2 oz (97.8 kg)  Height: 6\' 3"  (1.905 m)   Body mass index is 26.96 kg/m.   Wt Readings from Last 3 Encounters:  07/11/20 215 lb 11.2 oz (97.8 kg)  07/11/19 220 lb (99.8 kg)  06/10/19 220 lb 8 oz (100 kg)     Health Maintenance Due  Topic Date Due  . COLONOSCOPY (Pts 45-57yrs Insurance coverage will need to be confirmed)  Never done  . COVID-19 Vaccine (3 - Booster for Pfizer series) 03/10/2020    There are no preventive care reminders to display for this patient.  Lab Results  Component Value Date   TSH 5.440 (H) 06/10/2019   Lab Results  Component Value Date   WBC 4.2 07/11/2019   HGB 14.9 07/11/2019   HCT 44.7 07/11/2019   MCV 91.0 07/11/2019   PLT 215 07/11/2019   Lab Results  Component Value Date   NA 139 07/11/2019   K 4.5 07/11/2019   CO2 26 07/11/2019   GLUCOSE 101 (H) 07/11/2019   BUN 13 07/11/2019   CREATININE 1.12 07/11/2019   BILITOT 0.9 07/11/2019   ALKPHOS 41 07/11/2019   AST 33 07/11/2019   ALT 47 (H) 07/11/2019   PROT 7.0 07/11/2019   ALBUMIN 4.1 07/11/2019   CALCIUM 9.7 07/11/2019   ANIONGAP 9 07/11/2019   Lab Results  Component Value Date   CHOL 176 06/10/2019   Lab Results  Component Value Date   HDL 53  06/10/2019   Lab Results  Component Value Date   LDLCALC 87 06/10/2019   Lab Results  Component Value Date   TRIG 214 (H) 06/10/2019   Lab Results  Component Value Date   CHOLHDL 3.3 06/10/2019   Lab Results  Component Value Date   HGBA1C 5.3 06/10/2019      Assessment & Plan:  1. Hypothyroidism Patient to have fasting labs done later this week. Will adjust dose levothyroxine for hi to take everyday.   2. Mixed hyperlipidemia Has not been taking atorvastatin for about 4 months. Check fasting lipid panel and treat as indicated.   3. Vitamin D deficiency Check vitamin d level and treat as indicated.   4. Esophageal dysphagia Patient has family history of esophageal narrowing. Will refer to GI for further evaluation.  - Ambulatory referral to Gastroenterology  5. Screening for colon cancer Refer to GI - Ambulatory referral to Gastroenterology   Problem List Items Addressed This Visit      Digestive   Esophageal dysphagia   Relevant Orders   Ambulatory referral to Gastroenterology     Endocrine   Hypothyroidism - Primary (Chronic)     Other   Vitamin D deficiency (Chronic)   Mixed hyperlipidemia   Screening for colon cancer   Relevant Orders   Ambulatory referral to Gastroenterology        Follow-up: Return in about 6 months (around 01/10/2021) for general physical - needs FBW later this week - add Free T4, PSA, and vitamin d .    Ronnell Freshwater, NP

## 2020-07-14 ENCOUNTER — Other Ambulatory Visit: Payer: Self-pay

## 2020-07-14 ENCOUNTER — Other Ambulatory Visit: Payer: 59

## 2020-07-14 DIAGNOSIS — E079 Disorder of thyroid, unspecified: Secondary | ICD-10-CM

## 2020-07-14 DIAGNOSIS — E559 Vitamin D deficiency, unspecified: Secondary | ICD-10-CM

## 2020-07-14 DIAGNOSIS — Z0000 Encounter for general adult medical examination without abnormal findings: Secondary | ICD-10-CM

## 2020-07-15 ENCOUNTER — Encounter: Payer: Self-pay | Admitting: Nurse Practitioner

## 2020-07-15 ENCOUNTER — Other Ambulatory Visit: Payer: Self-pay | Admitting: Nurse Practitioner

## 2020-07-15 DIAGNOSIS — E079 Disorder of thyroid, unspecified: Secondary | ICD-10-CM

## 2020-07-15 DIAGNOSIS — E782 Mixed hyperlipidemia: Secondary | ICD-10-CM

## 2020-07-15 DIAGNOSIS — E781 Pure hyperglyceridemia: Secondary | ICD-10-CM

## 2020-07-15 MED ORDER — ATORVASTATIN CALCIUM 40 MG PO TABS
40.0000 mg | ORAL_TABLET | Freq: Every day | ORAL | 1 refills | Status: DC
Start: 2020-07-15 — End: 2022-07-06

## 2020-07-15 MED ORDER — LEVOTHYROXINE SODIUM 200 MCG PO TABS
200.0000 ug | ORAL_TABLET | Freq: Every day | ORAL | 3 refills | Status: DC
Start: 2020-07-15 — End: 2021-04-28

## 2020-07-15 NOTE — Progress Notes (Signed)
myChart message sent to patient. Increase levothyroxine to 259mcg and added back atorvastatin at 10mg  every evening.

## 2020-07-15 NOTE — Progress Notes (Signed)
Added back atorvastatin 10mg  every evening and sent new prescription to [facility]

## 2020-07-15 NOTE — Progress Notes (Signed)
TSH >10. Increasing dose levothyroxine from 165mcg daily to 218mcg daily. Patient should make change as soon as possible.

## 2020-07-22 LAB — LIPID PANEL
Chol/HDL Ratio: 5 ratio (ref 0.0–5.0)
Cholesterol, Total: 256 mg/dL — ABNORMAL HIGH (ref 100–199)
HDL: 51 mg/dL (ref >39)
LDL Chol Calc (NIH): 171 mg/dL — ABNORMAL HIGH (ref 0–99)
Triglycerides: 183 mg/dL — ABNORMAL HIGH (ref 0–149)
VLDL Cholesterol Cal: 34 mg/dL (ref 5–40)

## 2020-07-22 LAB — CBC
Hematocrit: 43.1 % (ref 37.5–51.0)
Hemoglobin: 14.9 g/dL (ref 13.0–17.7)
MCH: 31.2 pg (ref 26.6–33.0)
MCHC: 34.6 g/dL (ref 31.5–35.7)
MCV: 90 fL (ref 79–97)
Platelets: 189 10*3/uL (ref 150–450)
RBC: 4.77 x10E6/uL (ref 4.14–5.80)
RDW: 14.1 % (ref 11.6–15.4)
WBC: 4.6 10*3/uL (ref 3.4–10.8)

## 2020-07-22 LAB — COMPREHENSIVE METABOLIC PANEL
ALT: 24 IU/L (ref 0–44)
AST: 21 IU/L (ref 0–40)
Albumin/Globulin Ratio: 1.9 (ref 1.2–2.2)
Albumin: 4.5 g/dL (ref 3.8–4.9)
Alkaline Phosphatase: 49 IU/L (ref 44–121)
BUN/Creatinine Ratio: 16 (ref 9–20)
BUN: 17 mg/dL (ref 6–24)
Bilirubin Total: 0.5 mg/dL (ref 0.0–1.2)
CO2: 24 mmol/L (ref 20–29)
Calcium: 9.2 mg/dL (ref 8.7–10.2)
Chloride: 103 mmol/L (ref 96–106)
Creatinine, Ser: 1.06 mg/dL (ref 0.76–1.27)
Globulin, Total: 2.4 g/dL (ref 1.5–4.5)
Glucose: 92 mg/dL (ref 65–99)
Potassium: 4.3 mmol/L (ref 3.5–5.2)
Sodium: 142 mmol/L (ref 134–144)
Total Protein: 6.9 g/dL (ref 6.0–8.5)
eGFR: 83 mL/min/{1.73_m2} (ref >59)

## 2020-07-22 LAB — HEMOGLOBIN A1C
Est. average glucose Bld gHb Est-mCnc: 114 mg/dL
Hgb A1c MFr Bld: 5.6 % (ref 4.8–5.6)

## 2020-07-22 LAB — TSH: TSH: 10.3 u[IU]/mL — ABNORMAL HIGH (ref 0.450–4.500)

## 2020-07-22 LAB — T4: T4, Total: 6.6 ug/dL (ref 4.5–12.0)

## 2020-07-22 LAB — VITAMIN D 1,25 DIHYDROXY
Vitamin D 1, 25 (OH)2 Total: 39 pg/mL
Vitamin D2 1, 25 (OH)2: <10 pg/mL
Vitamin D3 1, 25 (OH)2: 39 pg/mL

## 2020-07-22 LAB — PSA: Prostate Specific Ag, Serum: 1.2 ng/mL (ref 0.0–4.0)

## 2020-11-07 IMAGING — CT CT HEAD W/O CM
4 series · 16 of 47 positions shown, 18 images · non-contrast
Comparison: 01/02/2007

CLINICAL DATA: Visual changes, lightheadedness, speech difficulty,
symptoms transient

EXAM:
CT HEAD WITHOUT CONTRAST
TECHNIQUE: Contiguous axial images were obtained from the base of the skull
through the vertex without intravenous contrast.

[Series 3: head wo · axial · 0.46mm/px · z∈[+906,+1022]mm · 7 of 31 slices shown, 9 images]
[im 4/31  brain]
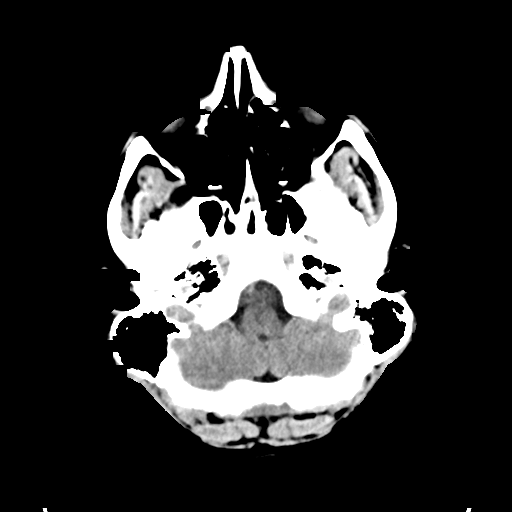
[im 4/31  bone]
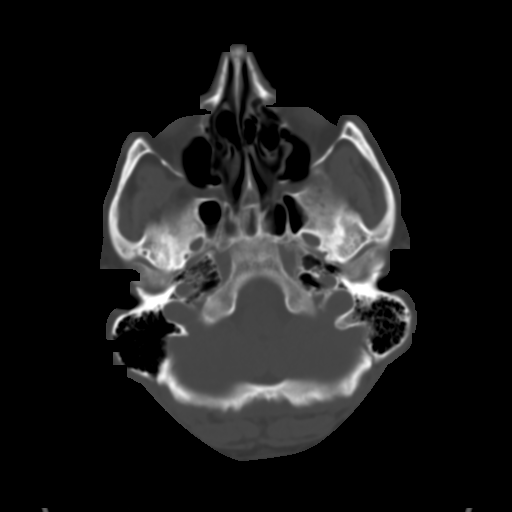
[im 8/31  brain]
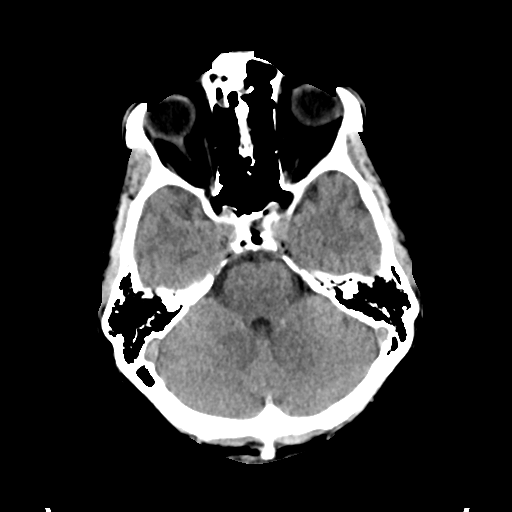
[im 12/31  brain]
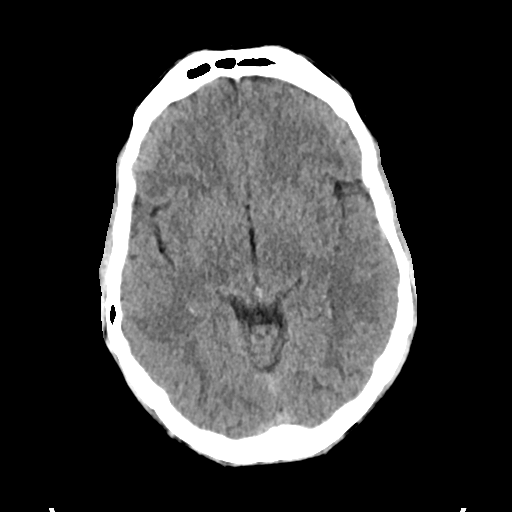
[im 16/31  brain]
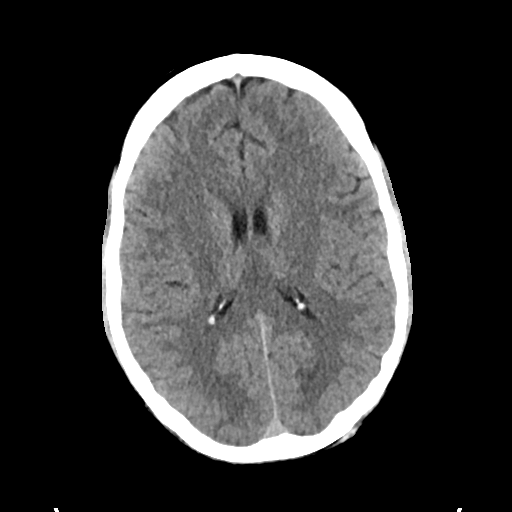
[im 19/31  brain]
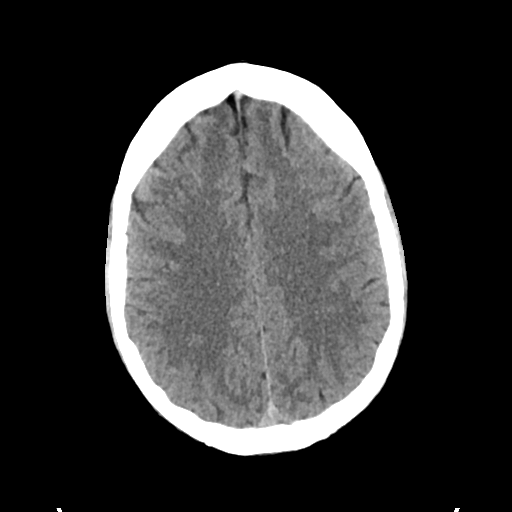
[im 19/31  bone]
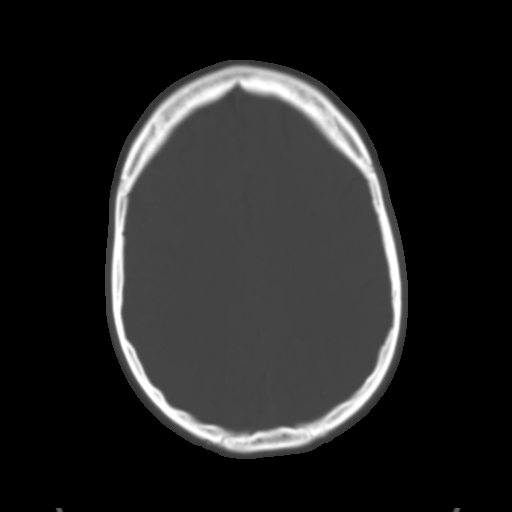
[im 23/31  brain]
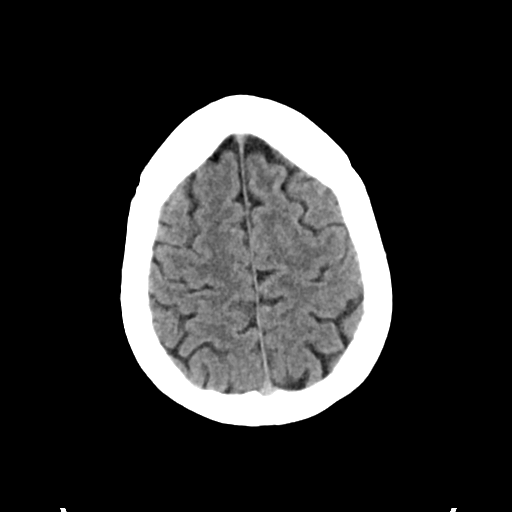
[im 27/31  brain]
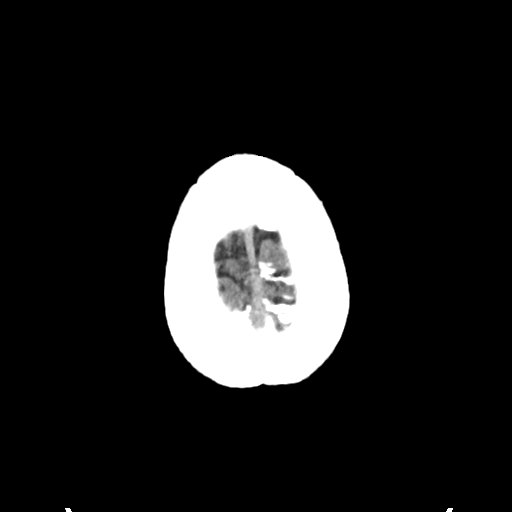

[Series 4: head bone · axial · 0.46mm/px · z∈[+906,+936]mm · 3 of 76 slices shown]
[im 8/76  bone]
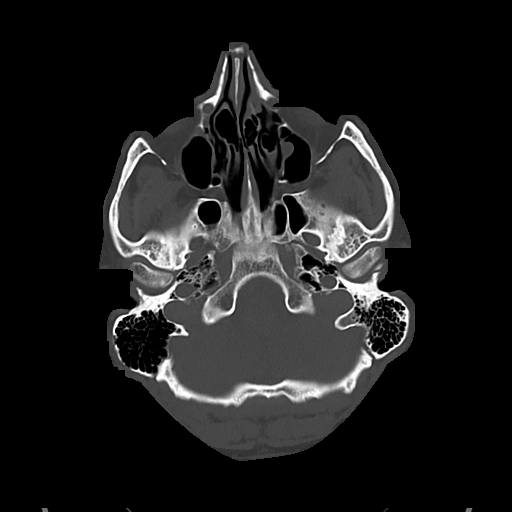
[im 16/76  bone]
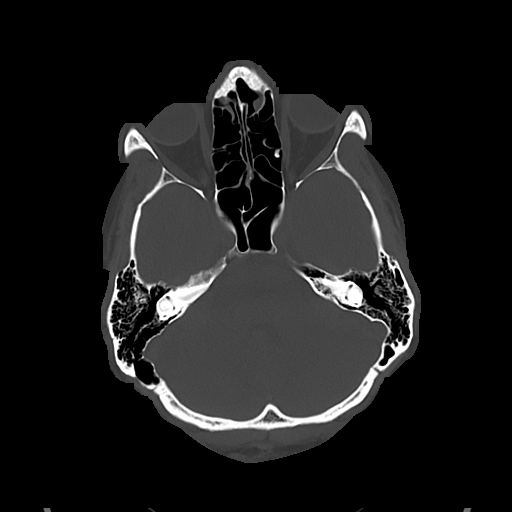
[im 23/76  bone]
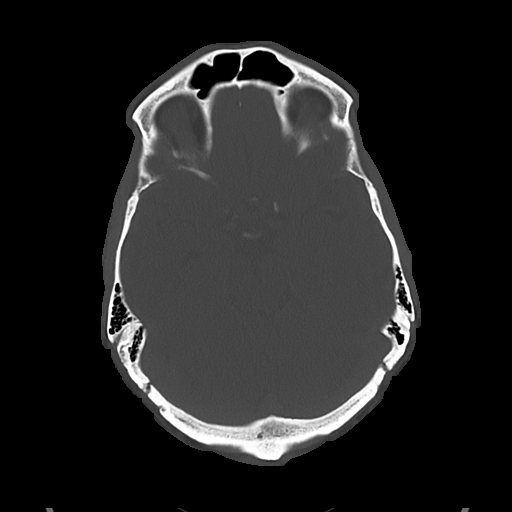

[Series 5: cor soft · coronal · 0.35mm/px · 3 of 69 slices shown]
[im 23/69  brain]
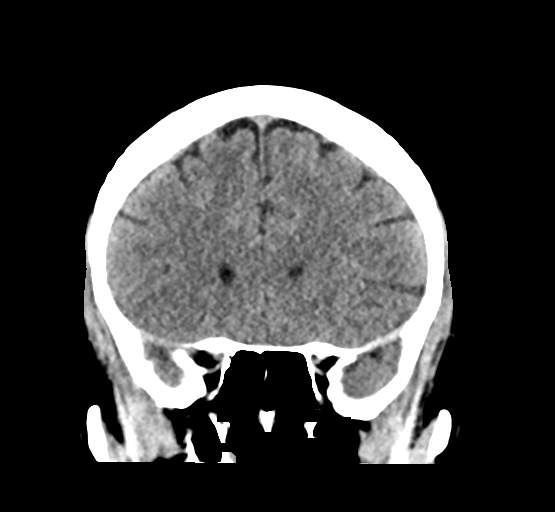
[im 31/69  brain]
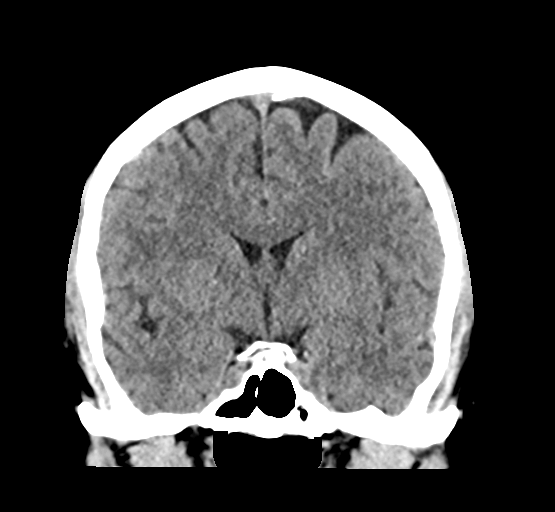
[im 38/69  brain]
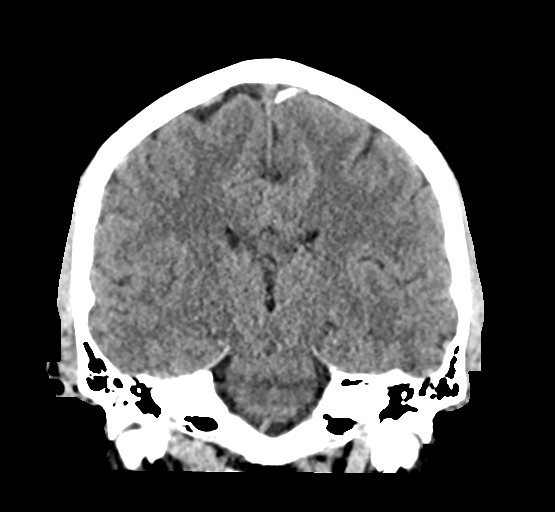

[Series 6: sag soft · sagittal · 0.32mm/px · 3 of 54 slices shown]
[im 18/54  brain]
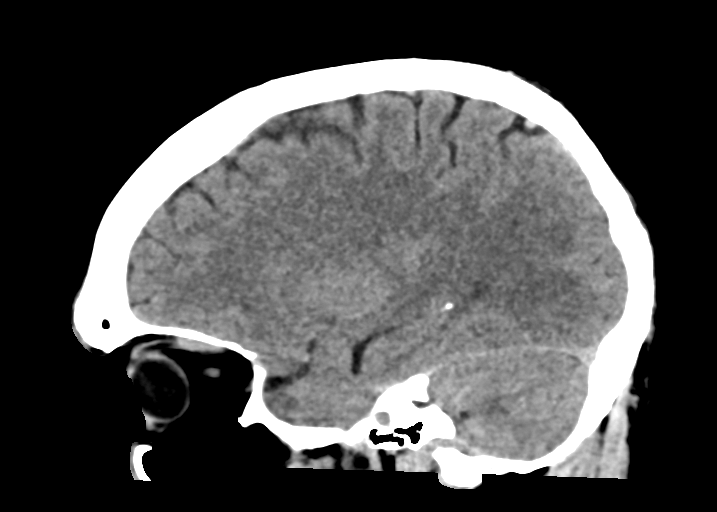
[im 27/54  brain]
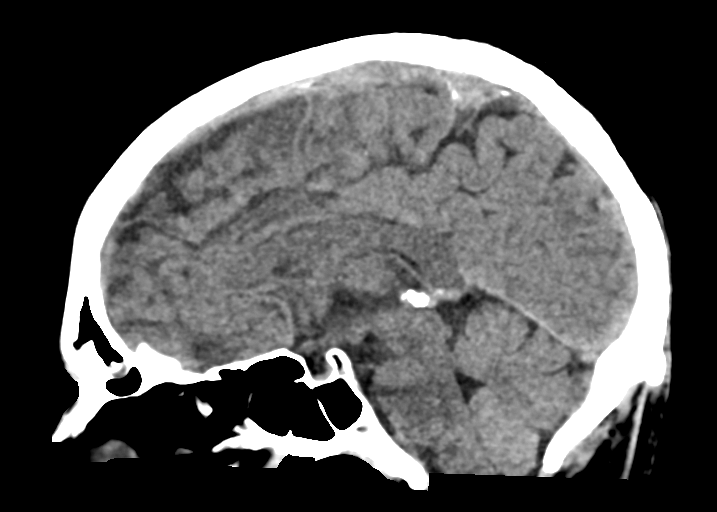
[im 36/54  brain]
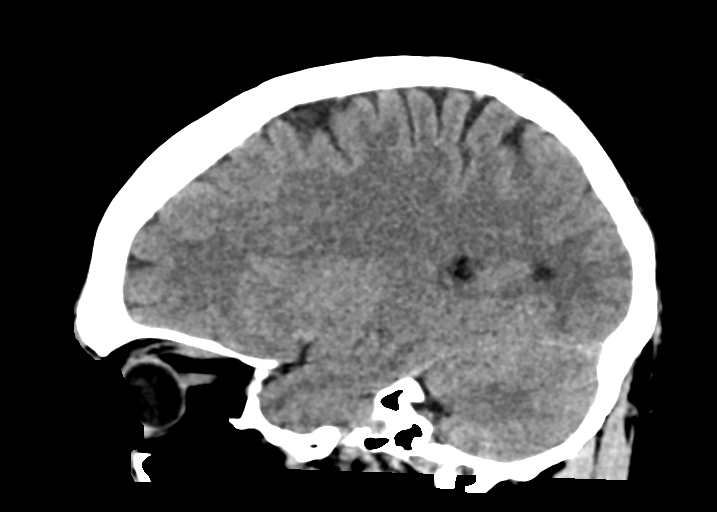

[16 of 47 positions shown; findings below may reference images not displayed]

FINDINGS: Brain: No acute infarct or hemorrhage. Lateral ventricles and
midline structures are unremarkable. No acute extra-axial fluid
collections. No mass effect.

Vascular: No hyperdense vessel or unexpected calcification.

Skull: Normal. Negative for fracture or focal lesion.

Sinuses/Orbits: Minimal polypoid mucosal thickening within the
ethmoid and maxillary sinuses.

Other: None.
IMPRESSION: 1. No acute intracranial process.

## 2020-11-11 ENCOUNTER — Ambulatory Visit
Admission: EM | Admit: 2020-11-11 | Discharge: 2020-11-11 | Disposition: A | Discharge status: Home / Self Care | Payer: 59

## 2020-11-11 ENCOUNTER — Other Ambulatory Visit: Payer: Self-pay

## 2020-11-11 ENCOUNTER — Encounter: Payer: Self-pay | Admitting: Emergency Medicine

## 2020-11-11 DIAGNOSIS — J069 Acute upper respiratory infection, unspecified: Secondary | ICD-10-CM | POA: Diagnosis not present

## 2020-11-11 DIAGNOSIS — H6503 Acute serous otitis media, bilateral: Secondary | ICD-10-CM

## 2020-11-11 MED ORDER — FLUTICASONE PROPIONATE 50 MCG/ACT NA SUSP: 1.0000 | Freq: Every day | NASAL | 0 refills | Status: DC

## 2020-11-11 MED ORDER — ALBUTEROL SULFATE HFA 108 (90 BASE) MCG/ACT IN AERS: 1.0000 | INHALATION_SPRAY | Freq: Four times a day (QID) | RESPIRATORY_TRACT | 0 refills | Status: DC | PRN

## 2020-11-11 MED ORDER — CETIRIZINE HCL 10 MG PO TABS: 10.0000 mg | ORAL_TABLET | Freq: Every day | ORAL | 0 refills | Status: DC

## 2020-11-11 MED ORDER — DM-GUAIFENESIN ER 30-600 MG PO TB12: 1.0000 | ORAL_TABLET | Freq: Two times a day (BID) | ORAL | 0 refills | Status: DC

## 2020-11-11 NOTE — ED Triage Notes (Signed)
Patient just returned home from [place], having chest congestion, head congestion, productive cough, headache, lightheaded.  Patient did take 2 rapid COVID tests that were negative.  Patient is vaccinated for COVID.

## 2020-11-11 NOTE — Discharge Instructions (Addendum)
Your symptoms are likely from a viral upper respiratory infection.  You have been prescribed for medications to help with this.  Please follow-up if symptoms worsen or do not improve.

## 2020-11-11 NOTE — ED Provider Notes (Signed)
[facility]    CSN: [id number] Arrival date & time: 11/11/20  [id number]      History   Chief Complaint Chief Complaint  Patient presents with   Cough    HPI DOMNICK ITKIN is a 55 y.o. male.   Patient presents with 2-day history of cough, nasal congestion, intermittent headache, fatigue, and feelings of "chest congestion".  Denies any chest pain or shortness of breath.  Denies any fevers or known sick contacts.  Patient did recently travel back from [place].  Patient took two at home COVID-19 tests that were both negative.  Has used over-the-counter teas to help with symptoms with some improvement.   Cough  Past Medical History:  Diagnosis Date   Allergy    Anxiety    Fatigue    discomfort from hemorrhoids leading to lack of sleep and fatigue    GERD (gastroesophageal reflux disease)    Substance abuse ([place])    Thyroid disease    hypothyroid    Patient Active Problem List   Diagnosis Date Noted   Esophageal dysphagia 07/11/2020   Screening for colon cancer 07/11/2020   Hx of medication noncompliance 03/17/2019   Mood disorder ([place]) 05/22/2017   Excessive drinking alcohol-  situational  05/22/2017   Blackout spell 05/22/2017   Low testosterone in male 05/22/2017   Adjustment disorder with mixed anxiety and depressed mood 09/13/2016   Remote History of cocaine use- for about 10 yrs 09/04/2016   Family history of coronary artery disease- Annamarie Major- AMI early 33's; Dad- stents in early 60's) 09/04/2016   Family history of prostate cancer in father- 58 yo at onset 09/04/2016   Adrenal adenoma, left 09/04/2016   Colon cancer screening 09/04/2016   Overweight (BMI 25.0-29.9) 09/04/2016   Hypertriglyceridemia 10/08/2015   Fatigue 09/13/2015   Paronychia of finger of right hand 09/08/2015   History of smoking 09/05/2015   Mixed hyperlipidemia 08/29/2015   Prediabetes 08/29/2015   Vitamin D deficiency 08/29/2015   Hypothyroidism 08/29/2015   GERD  (gastroesophageal reflux disease) 08/29/2015   Irritable bowel syndrome with constipation 08/12/2015   Acute gross stress reaction / anxiety 08/12/2015   Cough 08/09/2015   Abnormal CXR 08/09/2015   ETD (eustachian tube dysfunction) 08/09/2015   Environmental and seasonal allergies 02/20/2011    Past Surgical History:  Procedure Laterality Date   HAND [place]   skin graph surgery on left hand        Home Medications    Prior to Admission medications   Medication Sig Start Date End Date Taking? Authorizing Provider  albuterol (VENTOLIN HFA) 108 (90 Base) MCG/ACT inhaler Inhale 1-2 puffs into the lungs every 6 (six) hours as needed for wheezing or shortness of breath. 11/11/20  Yes Odis Luster, FNP  Ascorbic Acid (VITAMIN C PO) Take by mouth.   Yes [provider]  atorvastatin (LIPITOR) 40 MG tablet Take 1 tablet (40 mg total) by mouth daily. 07/15/20  Yes Boscia, Greer Ee, NP  cetirizine (ZYRTEC) 10 MG tablet Take 1 tablet (10 mg total) by mouth daily. 11/11/20  Yes Odis Luster, FNP  Cholecalciferol (VITAMIN D3) 125 MCG (5000 UT) CAPS Take 5,000 Units by mouth See admin instructions. Take 5,000 units by mouth once a day on Mon/Tues/Wed/Thurs/Fri/Sat   Yes [provider]  dextromethorphan-guaiFENesin (MUCINEX DM) 30-600 MG 12hr tablet Take 1 tablet by mouth 2 (two) times daily. 11/11/20  Yes Odis Luster, FNP  fluticasone (FLONASE) 50 MCG/ACT nasal spray Place 1 spray into both nostrils daily for 3 days. Please limit use to 3 days 11/11/20 11/14/20 Yes Odis Luster, FNP  levothyroxine (SYNTHROID) 200 MCG tablet Take 1 tablet (200 mcg total) by mouth daily before breakfast. 07/15/20  Yes Boscia, Greer Ee, NP  Multiple Vitamins-Minerals (ZINC PO) Take by mouth.   Yes [provider]  Omega-3 Fatty Acids (FISH OIL PO) Take 2 capsules by mouth daily.   Yes [provider]  TURMERIC PO Take 1 capsule by mouth daily.    Yes [provider]  Vitamin D, Ergocalciferol, (DRISDOL) 1.25 MG (50000 UNIT) CAPS capsule TAKE 1 CAPSULE BY MOUTH EVERY 7 DAYS. Patient taking differently: Take 50,000 Units by mouth every Sunday. 07/07/19  Yes Opalski, Neoma Laming, DO  Magnesium 250 MG TABS Take 250 mg by mouth daily.    [provider]  promethazine-dextromethorphan (PROMETHAZINE-DM) 6.25-15 MG/5ML syrup Take 5 mLs by mouth at bedtime as needed for cough. 04/20/20   Jaynee Eagles, PA-C    Family History Family History  Problem Relation Age of Onset   Hypertension Mother    Heart murmur Mother    Kidney disease Father        stones   Heart disease Father    Hyperlipidemia Father    Cancer Maternal Grandmother    Heart disease Maternal Grandfather    Heart disease Paternal Grandmother    Heart disease Paternal Grandfather     Social History Social History   Tobacco Use   Smoking status: Former    Packs/day: 0.50    Years: 30.00    Pack years: 15.00    Types: Cigarettes   Smokeless tobacco: Former  Scientific laboratory technician Use: Some days  Substance Use Topics   Alcohol use: Yes    Alcohol/week: 3.0 standard drinks    Types: 3 Standard drinks or equivalent per week   Drug use: No     Allergies   Patient has no known allergies.   Review of Systems Review of Systems Per HPI  Physical Exam Triage Vital Signs ED Triage Vitals  Enc Vitals Group     BP 11/11/20 1021 126/79     Pulse Rate 11/11/20 1021 93     Resp 11/11/20 1021 18     Temp 11/11/20 1021 98.7 F (37.1 C)     Temp Source 11/11/20 1021 Oral     SpO2 11/11/20 1021 97 %     Weight 11/11/20 1024 218 lb (98.9 kg)     Height 11/11/20 1024 '6\' 2"'$  (1.88 m)     Head Circumference --      Peak Flow --      Pain Score 11/11/20 1048 0     Pain Loc --      Pain Edu? --      Excl. in [place]? --    No data found.  Updated Vital Signs BP 126/79 (BP Location: Left Arm)   Pulse 93   Temp 98.7 F (37.1 C) (Oral)   Resp 18   Ht '6\' 2"'$   (1.88 m)   Wt 218 lb (98.9 kg)   SpO2 97%   BMI 27.99 kg/m   Visual Acuity Right Eye Distance:   Left Eye Distance:   Bilateral Distance:    Right Eye Near:   Left Eye Near:    Bilateral Near:     Physical Exam Constitutional:      General: He is not in  acute distress.    Appearance: Normal appearance.  HENT:     Head: Normocephalic and atraumatic.     Right Ear: Ear canal normal. A middle ear effusion is present. Tympanic membrane is not erythematous or bulging.     Left Ear: Ear canal normal. A middle ear effusion is present. Tympanic membrane is not erythematous or bulging.     Nose: Congestion and rhinorrhea present. Rhinorrhea is clear.     Mouth/Throat:     Mouth: Mucous membranes are moist.     Pharynx: Oropharynx is clear. No posterior oropharyngeal erythema.  Eyes:     Extraocular Movements: Extraocular movements intact.     Conjunctiva/sclera: Conjunctivae normal.     Pupils: Pupils are equal, round, and reactive to light.  Cardiovascular:     Rate and Rhythm: Normal rate and regular rhythm.     Pulses: Normal pulses.     Heart sounds: Normal heart sounds.  Pulmonary:     Effort: Pulmonary effort is normal. No respiratory distress.     Breath sounds: Wheezing present.     Comments: Mild wheezing noted to right lower lobe. Abdominal:     General: Abdomen is flat. Bowel sounds are normal.     Palpations: Abdomen is soft.  Musculoskeletal:        General: Normal range of motion.     Cervical back: Normal range of motion.  Skin:    General: Skin is warm and dry.  Neurological:     General: No focal deficit present.     Mental Status: He is alert and oriented to person, place, and time. Mental status is at baseline.  Psychiatric:        Mood and Affect: Mood normal.        Behavior: Behavior normal.     UC Treatments / Results  Labs (all labs ordered are listed, but only abnormal results are displayed) Labs Reviewed - No data to  display  EKG   Radiology No results found.  Procedures Procedures (including critical care time)  Medications Ordered in UC Medications - No data to display  Initial Impression / Assessment and Plan / UC Course  I have reviewed the triage vital signs and the nursing notes.  Pertinent labs & imaging results that were available during my care of the patient were reviewed by me and considered in my medical decision making (see chart for details).     Patient presents with symptoms likely from a viral upper respiratory infection. Differential includes bacterial pneumonia, sinusitis, allergic rhinitis, Covid 19. Do not suspect underlying cardiopulmonary process. Symptoms seem unlikely related to ACS, CHF or COPD exacerbations, pneumonia, pneumothorax. Patient is nontoxic appearing and not in need of emergent medical intervention.  Patient was prescribed albuterol inhaler, Mucinex DM, cetirizine and Flonase for symptoms and serous otitis media.  Do not think COVID-19 retesting is necessary at this time as patient has had two previous COVID-19 tests.  Return if symptoms fail to improve in 1-2 weeks or you develop shortness of breath, chest pain, severe headache. Patient states understanding and is agreeable.  Discharged with PCP or urgent care followup.  Final Clinical Impressions(s) / UC Diagnoses   Final diagnoses:  Viral upper respiratory tract infection with cough  Non-recurrent acute serous otitis media of both ears     Discharge Instructions      Your symptoms are likely from a viral upper respiratory infection.  You have been prescribed for medications to help with this.  Please  follow-up if symptoms worsen or do not improve.      ED Prescriptions     Medication Sig Dispense Auth. Provider   dextromethorphan-guaiFENesin (MUCINEX DM) 30-600 MG 12hr tablet Take 1 tablet by mouth 2 (two) times daily. 30 tablet Odis Luster, FNP   cetirizine (ZYRTEC) 10 MG tablet Take 1  tablet (10 mg total) by mouth daily. 30 tablet Odis Luster, FNP   albuterol (VENTOLIN HFA) 108 (90 Base) MCG/ACT inhaler Inhale 1-2 puffs into the lungs every 6 (six) hours as needed for wheezing or shortness of breath. 1 each Odis Luster, FNP   fluticasone (FLONASE) 50 MCG/ACT nasal spray Place 1 spray into both nostrils daily for 3 days. Please limit use to 3 days 16 g Odis Luster, FNP      PDMP not reviewed this encounter.   Odis Luster, FNP 11/11/20 1102

## 2021-04-27 ENCOUNTER — Other Ambulatory Visit: Payer: Self-pay | Admitting: Nurse Practitioner

## 2021-04-27 DIAGNOSIS — E079 Disorder of thyroid, unspecified: Secondary | ICD-10-CM

## 2021-05-31 ENCOUNTER — Ambulatory Visit (INDEPENDENT_AMBULATORY_CARE_PROVIDER_SITE_OTHER): Payer: 59

## 2021-05-31 ENCOUNTER — Other Ambulatory Visit: Payer: Self-pay

## 2021-05-31 ENCOUNTER — Encounter: Payer: Self-pay | Admitting: Emergency Medicine

## 2021-05-31 ENCOUNTER — Ambulatory Visit
Admission: EM | Admit: 2021-05-31 | Discharge: 2021-05-31 | Disposition: A | Discharge status: Home / Self Care | Payer: 59 | Attending: Internal Medicine | Admitting: Internal Medicine

## 2021-05-31 DIAGNOSIS — M542 Cervicalgia: Secondary | ICD-10-CM | POA: Diagnosis not present

## 2021-05-31 MED ORDER — CYCLOBENZAPRINE HCL 5 MG PO TABS: 5.0000 mg | ORAL_TABLET | Freq: Two times a day (BID) | ORAL | 0 refills | Status: DC | PRN

## 2021-05-31 MED ORDER — IBUPROFEN 600 MG PO TABS: 600.0000 mg | ORAL_TABLET | Freq: Four times a day (QID) | ORAL | 0 refills | Status: DC | PRN

## 2021-05-31 NOTE — ED Provider Notes (Signed)
?[facility] ? ? ? ?CSN: [id number] ?Arrival date & time: 05/31/21  1307 ? ? ?  ? ?History   ?Chief Complaint ?Chief Complaint  ?Patient presents with  ? Torticollis  ? ? ?HPI ?Andrew Blair is a 56 y.o. male.  ? ?Patient presents with right-sided neck pain and stiffness that started last night.  Denies any apparent injury.  Patient does report that he has history of cervical spinal stenosis that he was treated for approximately 10 to 15 years ago.  Has not had a flareup since then.  Denies any numbness or tingling.  Pain does radiate into right posterior shoulder.  Patient reports having difficulty holding his head up due to stiffness.  Has taken promethazine liquid for pain. ? ? ? ?Past Medical History:  ?Diagnosis Date  ? Allergy   ? Anxiety   ? Fatigue   ? discomfort from hemorrhoids leading to lack of sleep and fatigue   ? GERD (gastroesophageal reflux disease)   ? Substance abuse ([place])   ? Thyroid disease   ? hypothyroid  ? ? ?Patient Active Problem List  ? Diagnosis Date Noted  ? Esophageal dysphagia 07/11/2020  ? Screening for colon cancer 07/11/2020  ? Hx of medication noncompliance 03/17/2019  ? Mood disorder ([place]) 05/22/2017  ? Excessive drinking alcohol-  situational  05/22/2017  ? Blackout spell 05/22/2017  ? Low testosterone in male 05/22/2017  ? Adjustment disorder with mixed anxiety and depressed mood 09/13/2016  ? Remote History of cocaine use- for about 10 yrs 09/04/2016  ? Family history of coronary artery disease- Annamarie Major- AMI early 32's; Dad- stents in early 60's) 09/04/2016  ? Family history of prostate cancer in father- 20 yo at onset 09/04/2016  ? Adrenal adenoma, left 09/04/2016  ? Colon cancer screening 09/04/2016  ? Overweight (BMI 25.0-29.9) 09/04/2016  ? Hypertriglyceridemia 10/08/2015  ? Fatigue 09/13/2015  ? Paronychia of finger of right hand 09/08/2015  ? History of smoking 09/05/2015  ? Mixed hyperlipidemia 08/29/2015  ? Prediabetes 08/29/2015  ? Vitamin D  deficiency 08/29/2015  ? Hypothyroidism 08/29/2015  ? GERD (gastroesophageal reflux disease) 08/29/2015  ? Irritable bowel syndrome with constipation 08/12/2015  ? Acute gross stress reaction / anxiety 08/12/2015  ? Cough 08/09/2015  ? Abnormal CXR 08/09/2015  ? ETD (eustachian tube dysfunction) 08/09/2015  ? Environmental and seasonal allergies 02/20/2011  ? ? ?Past Surgical History:  ?Procedure Laterality Date  ? HAND SURGERY  1984  ? SKIN SURGERY  1984  ? skin graph surgery on left hand   ? ? ? ? ? ?Home Medications   ? ?Prior to Admission medications   ?Medication Sig Start Date End Date Taking? Authorizing Provider  ?albuterol (VENTOLIN HFA) 108 (90 Base) MCG/ACT inhaler Inhale 1-2 puffs into the lungs every 6 (six) hours as needed for wheezing or shortness of breath. 11/11/20  Yes Teodora Medici, FNP  ?Ascorbic Acid (VITAMIN C PO) Take by mouth.   Yes [provider]  ?atorvastatin (LIPITOR) 40 MG tablet Take 1 tablet (40 mg total) by mouth daily. 07/15/20  Yes Ronnell Freshwater, NP  ?cetirizine (ZYRTEC) 10 MG tablet Take 1 tablet (10 mg total) by mouth daily. 11/11/20  Yes Teodora Medici, FNP  ?Cholecalciferol (VITAMIN D3) 125 MCG (5000 UT) CAPS Take 5,000 Units by mouth See admin instructions. Take 5,000 units by mouth once a day on Mon/Tues/Wed/Thurs/Fri/Sat   Yes [provider]  ?cyclobenzaprine (FLEXERIL) 5 MG tablet Take 1 tablet (5 mg  total) by mouth 2 (two) times daily as needed for muscle spasms. 05/31/21  Yes Teodora Medici, FNP  ?dextromethorphan-guaiFENesin (MUCINEX DM) 30-600 MG 12hr tablet Take 1 tablet by mouth 2 (two) times daily. 11/11/20  Yes Tamina Cyphers, Michele Rockers, FNP  ?ibuprofen (ADVIL) 600 MG tablet Take 1 tablet (600 mg total) by mouth every 6 (six) hours as needed for mild pain. 05/31/21  Yes Teodora Medici, FNP  ?levothyroxine (SYNTHROID) 200 MCG tablet Take 1 tablet (200 mcg total) by mouth daily before breakfast. **PLEASE CONTACT OUR OFFICE TO SCHEDULE A FOLLOW UP FOR FUTURE MED  REFILLS** 04/28/21  Yes Ronnell Freshwater, NP  ?Magnesium 250 MG TABS Take 250 mg by mouth daily.   Yes [provider]  ?Multiple Vitamins-Minerals (ZINC PO) Take by mouth.   Yes [provider]  ?Omega-3 Fatty Acids (FISH OIL PO) Take 2 capsules by mouth daily.   Yes [provider]  ?promethazine-dextromethorphan (PROMETHAZINE-DM) 6.25-15 MG/5ML syrup Take 5 mLs by mouth at bedtime as needed for cough. 04/20/20  Yes Jaynee Eagles, PA-C  ?TURMERIC PO Take 1 capsule by mouth daily.   Yes [provider]  ?Vitamin D, Ergocalciferol, (DRISDOL) 1.25 MG (50000 UNIT) CAPS capsule TAKE 1 CAPSULE BY MOUTH EVERY 7 DAYS. ?Patient taking differently: Take 50,000 Units by mouth every Sunday. 07/07/19  Yes Opalski, Neoma Laming, DO  ?fluticasone (FLONASE) 50 MCG/ACT nasal spray Place 1 spray into both nostrils daily for 3 days. Please limit use to 3 days 11/11/20 11/14/20  Teodora Medici, FNP  ? ? ?Family History ?Family History  ?Problem Relation Age of Onset  ? Hypertension Mother   ? Heart murmur Mother   ? Kidney disease Father   ?     stones  ? Heart disease Father   ? Hyperlipidemia Father   ? Cancer Maternal Grandmother   ? Heart disease Maternal Grandfather   ? Heart disease Paternal Grandmother   ? Heart disease Paternal Grandfather   ? ? ?Social History ?Social History  ? ?Tobacco Use  ? Smoking status: Former  ?  Packs/day: 0.50  ?  Years: 30.00  ?  Pack years: 15.00  ?  Types: Cigarettes  ? Smokeless tobacco: Former  ?Vaping Use  ? Vaping Use: Some days  ?Substance Use Topics  ? Alcohol use: Yes  ?  Alcohol/week: 3.0 standard drinks  ?  Types: 3 Standard drinks or equivalent per week  ? Drug use: No  ? ? ? ?Allergies   ?Patient has no known allergies. ? ? ?Review of Systems ?Review of Systems ?Per HPI ? ?Physical Exam ?Triage Vital Signs ?ED Triage Vitals  ?Enc Vitals Group  ?   BP 05/31/21 1413 119/86  ?   Pulse Rate 05/31/21 1413 68  ?   Resp 05/31/21 1413 18  ?   Temp 05/31/21 1413 97.8 ?F  (36.6 ?C)  ?   Temp Source 05/31/21 1413 Oral  ?   SpO2 05/31/21 1413 98 %  ?   Weight 05/31/21 1415 218 lb 0.6 oz (98.9 kg)  ?   Height 05/31/21 1415 '6\' 2"'$  (1.88 m)  ?   Head Circumference --   ?   Peak Flow --   ?   Pain Score 05/31/21 1415 7  ?   Pain Loc --   ?   Pain Edu? --   ?   Excl. in [place]? --   ? ?No data found. ? ?Updated Vital Signs ?BP 119/86 (BP Location: Right  Arm)   Pulse 68   Temp 97.8 ?F (36.6 ?C) (Oral)   Resp 18   Ht '6\' 2"'$  (1.88 m)   Wt 218 lb 0.6 oz (98.9 kg)   SpO2 98%   BMI 27.99 kg/m?  ? ?Visual Acuity ?Right Eye Distance:   ?Left Eye Distance:   ?Bilateral Distance:   ? ?Right Eye Near:   ?Left Eye Near:    ?Bilateral Near:    ? ?Physical Exam ?Constitutional:   ?   General: He is not in acute distress. ?   Appearance: Normal appearance. He is not toxic-appearing or diaphoretic.  ?HENT:  ?   Head: Normocephalic and atraumatic.  ?Eyes:  ?   Extraocular Movements: Extraocular movements intact.  ?   Conjunctiva/sclera: Conjunctivae normal.  ?Neck:  ?   Comments: Tenderness to palpation to right paraspinal muscles that extends slightly into right trapezius muscle.  Full range of motion to left side.  Limited range of motion with rotation to right side.  Limited range of motion with flexion of the neck due to pain.  Grip strength 5/5. ?Pulmonary:  ?   Effort: Pulmonary effort is normal.  ?Musculoskeletal:  ?   Cervical back: No edema, erythema or crepitus. Pain with movement and muscular tenderness present. No spinous process tenderness.  ?Neurological:  ?   General: No focal deficit present.  ?   Mental Status: He is alert and oriented to person, place, and time. Mental status is at baseline.  ?   Cranial Nerves: Cranial nerves 2-12 are intact.  ?   Sensory: Sensation is intact.  ?   Motor: Motor function is intact.  ?   Coordination: Coordination is intact.  ?   Gait: Gait is intact.  ?Psychiatric:     ?   Mood and Affect: Mood normal.     ?   Behavior: Behavior normal.     ?   Thought  Content: Thought content normal.     ?   Judgment: Judgment normal.  ? ? ? ?UC Treatments / Results  ?Labs ?(all labs ordered are listed, but only abnormal results are displayed) ?Labs Reviewed - No data to display

## 2021-05-31 NOTE — ED Triage Notes (Signed)
Patient c/o right sided neck stiffness, does have a history of spinal stenosis in his neck several years ago.  The neck pain/stiffness woke him up out of his sleep.  Patient has taken Promethazine for the pain. ?

## 2021-05-31 NOTE — Discharge Instructions (Addendum)
X-ray was negative for any acute abnormality.  You have been prescribed 2 medications to help alleviate symptoms.  Please do not take any additional ibuprofen, Advil, Aleve while taking this prescription ibuprofen.  Please be advised that muscle relaxer can cause drowsiness.  Follow-up with orthopedist if pain persists or worsens. ?

## 2022-07-06 ENCOUNTER — Encounter: Payer: Self-pay | Admitting: Urology

## 2022-07-06 ENCOUNTER — Ambulatory Visit (INDEPENDENT_AMBULATORY_CARE_PROVIDER_SITE_OTHER): Payer: No Typology Code available for payment source | Admitting: Urology

## 2022-07-06 VITALS — BP 119/75 | HR 61 | Ht 74.0 in | Wt 218.0 lb

## 2022-07-06 DIAGNOSIS — E079 Disorder of thyroid, unspecified: Secondary | ICD-10-CM

## 2022-07-06 DIAGNOSIS — R6882 Decreased libido: Secondary | ICD-10-CM | POA: Diagnosis not present

## 2022-07-06 DIAGNOSIS — R3915 Urgency of urination: Secondary | ICD-10-CM

## 2022-07-06 DIAGNOSIS — Z125 Encounter for screening for malignant neoplasm of prostate: Secondary | ICD-10-CM

## 2022-07-06 DIAGNOSIS — E039 Hypothyroidism, unspecified: Secondary | ICD-10-CM

## 2022-07-06 DIAGNOSIS — R5383 Other fatigue: Secondary | ICD-10-CM | POA: Diagnosis not present

## 2022-07-06 DIAGNOSIS — N401 Enlarged prostate with lower urinary tract symptoms: Secondary | ICD-10-CM | POA: Diagnosis not present

## 2022-07-06 LAB — URINALYSIS, COMPLETE
Bilirubin, UA: NEGATIVE
Glucose, UA: NEGATIVE
Ketones, UA: NEGATIVE
Nitrite, UA: POSITIVE — AB
Protein,UA: NEGATIVE
RBC, UA: NEGATIVE
Specific Gravity, UA: >1.03 — ABNORMAL HIGH (ref 1.005–1.030)
Urobilinogen, Ur: 0.2 mg/dL (ref 0.2–1.0)
pH, UA: 5.5 (ref 5.0–7.5)

## 2022-07-06 LAB — MICROSCOPIC EXAMINATION

## 2022-07-06 LAB — BLADDER SCAN AMB NON-IMAGING: Scan Result: 172

## 2022-07-06 MED ORDER — LEVOTHYROXINE SODIUM 200 MCG PO TABS: 200.0000 ug | ORAL_TABLET | Freq: Every day | ORAL | 1 refills | Status: DC

## 2022-07-06 MED ORDER — TAMSULOSIN HCL 0.4 MG PO CAPS: 0.4000 mg | ORAL_CAPSULE | Freq: Every day | ORAL | 1 refills | Status: DC

## 2022-07-06 NOTE — Progress Notes (Signed)
I, Andrew Blair,acting as a scribe for Andrew Altes, MD.,have documented all relevant documentation on the behalf of Andrew Altes, MD,as directed by  Andrew Altes, MD while in the presence of Andrew Altes, MD.   I, Andrew Blair,acting as a scribe for Andrew Altes, MD.,have documented all relevant documentation on the behalf of Andrew Altes, MD,as directed by  Andrew Altes, MD while in the presence of Andrew Altes, MD.   07/06/2022 10:02 AM   Andrew Blair [birth year] [id number]  Chief Complaint  Patient presents with   New Patient (Initial Visit)    HPI: Andrew Blair is a 57 y.o. male here for evaluation of weak urinary stream and hesitancy.  Gradual changes over the last few years with urinary urgency, hesitancy, slow stream, and post-void dribbling.  Symptoms are intermittent.  IPSS today 19/35 with most bothersome symptoms of weak and intermittent urinary stream. No dysuria, flank/abdominal/pelvic pain, gross hematuria No prior evaluation or treatment. He has had increased tiredness, fatigue, and decreased motivation.  Testosterone level checked in March 2019 was 273.  PMH: Past Medical History:  Diagnosis Date   Allergy    Anxiety    Fatigue    discomfort from hemorrhoids leading to lack of sleep and fatigue    GERD (gastroesophageal reflux disease)    Substance abuse    Thyroid disease    hypothyroid    Surgical History: Past Surgical History:  Procedure Laterality Date   HAND SURGERY  1984   SKIN SURGERY  1984   skin graph surgery on left hand     Home Medications:  Allergies as of 07/06/2022       Reactions   Cyclobenzaprine Other (See Comments)   Fainting, dizzy, lightheaded        Medication List        Accurate as of July 06, 2022 10:02 AM. If you have any questions, ask your nurse or doctor.          STOP taking these medications    albuterol 108 (90 Base) MCG/ACT inhaler Commonly known as:  VENTOLIN HFA Stopped by: Andrew Altes, MD   atorvastatin 40 MG tablet Commonly known as: Lipitor Stopped by: Andrew Altes, MD   cetirizine 10 MG tablet Commonly known as: ZYRTEC Stopped by: Andrew Altes, MD   cyclobenzaprine 5 MG tablet Commonly known as: FLEXERIL Stopped by: Andrew Altes, MD   fluticasone 50 MCG/ACT nasal spray Commonly known as: FLONASE Stopped by: Andrew Altes, MD   promethazine-dextromethorphan 6.25-15 MG/5ML syrup Commonly known as: PROMETHAZINE-DM Stopped by: Andrew Altes, MD   VITAMIN C PO Stopped by: Andrew Altes, MD   Vitamin D (Ergocalciferol) 1.25 MG (50000 UNIT) Caps capsule Commonly known as: DRISDOL Stopped by: Andrew Altes, MD       TAKE these medications    dextromethorphan-guaiFENesin 30-600 MG 12hr tablet Commonly known as: MUCINEX DM Take 1 tablet by mouth 2 (two) times daily.   FISH OIL PO Take 2 capsules by mouth daily.   ibuprofen 600 MG tablet Commonly known as: ADVIL Take 1 tablet (600 mg total) by mouth every 6 (six) hours as needed for mild pain.   levothyroxine 200 MCG tablet Commonly known as: SYNTHROID Take 1 tablet (200 mcg total) by mouth daily before breakfast. **PLEASE CONTACT OUR OFFICE TO SCHEDULE A FOLLOW UP FOR FUTURE MED REFILLS**   Magnesium 250 MG Tabs Take 250 mg  by mouth daily.   tamsulosin 0.4 MG Caps capsule Commonly known as: FLOMAX Take 1 capsule (0.4 mg total) by mouth daily. Started by: Andrew Altes, MD   TURMERIC PO Take 1 capsule by mouth daily.   Vitamin D3 125 MCG (5000 UT) Caps Take 5,000 Units by mouth See admin instructions. Take 5,000 units by mouth once a day on Mon/Tues/Wed/Thurs/Fri/Sat   ZINC PO Take by mouth.        Allergies:  Allergies  Allergen Reactions   Cyclobenzaprine Other (See Comments)    Fainting, dizzy, lightheaded    Family History: Family History  Problem Relation Age of Onset   Hypertension Mother    Heart murmur  Mother    Kidney disease Father        stones   Heart disease Father    Hyperlipidemia Father    Cancer Maternal Grandmother    Heart disease Maternal Grandfather    Heart disease Paternal Grandmother    Heart disease Paternal Grandfather     Social History:  reports that he has quit smoking. His smoking use included cigarettes. He has a 15.00 pack-year smoking history. He has quit using smokeless tobacco. He reports current alcohol use of about 3.0 standard drinks of alcohol per week. He reports that he does not use drugs.   Physical Exam: BP 119/75   Pulse 61   Ht  (1.88 m)   Wt 218 lb (98.9 kg)   BMI 27.99 kg/m   Constitutional:  Alert and oriented, No acute distress. HEENT: [place] AT, Respiratory: Normal respiratory effort, no increased work of breathing. GU: Prostate 45 grams, smooth without nodules Skin: No rashes, bruises or suspicious lesions. Psychiatric: Normal mood and affect.  Assessment & Plan:    BPH with LUTS Incomplete bladder emptying with PVR 172 mL. Moderate-severe LUTS  Trial Tamsulosin 0.4 mg daily Follow-up in 1 month for symptom recheck and repeat PVR  Tiredness/fatigue Testosterone level low normal in 2019.  Repeat testosterone level and LH on follow-up.   Hypothyroidism His primary care left the area over 2 years ago, and he requested a refill of Thyroxine. I told him I could refill for 1-2 months, but he would need to establish a primary care. He was given the phone number of [facility].  Prostate cancer screening We discussed AUA recommendation for prostate cancer screening is PSA and DRE annually until between the ages of 58-69. Last PSA in 2022 was 1.2. PSA on follow-up  I have reviewed the above documentation for accuracy and completeness, and I agree with the above.   Andrew Altes, MD  [facility] [address] [place], [place] [address] [phone]

## 2022-07-06 NOTE — Patient Instructions (Signed)
[facility] [phone]

## 2022-07-12 ENCOUNTER — Telehealth: Payer: Self-pay | Admitting: *Deleted

## 2022-07-12 LAB — CULTURE, URINE COMPREHENSIVE

## 2022-07-12 MED ORDER — SULFAMETHOXAZOLE-TRIMETHOPRIM 800-160 MG PO TABS: 1.0000 | ORAL_TABLET | Freq: Two times a day (BID) | ORAL | 0 refills | Status: DC

## 2022-07-12 NOTE — Telephone Encounter (Signed)
Notified patient as instructed, patient pleased. Discussed follow-up appointments, patient agrees  

## 2022-07-12 NOTE — Telephone Encounter (Signed)
-----   Message from Riki Altes, MD sent at 07/12/2022  1:04 PM EDT ----- Please note urinalysis in the office last week was showing inflammatory cells and urine culture just returned positive for bacteria.  Please send Rx Septra DS 1 twice daily x 14 days to pharmacy.  Follow-up as scheduled

## 2022-08-08 ENCOUNTER — Other Ambulatory Visit: Payer: No Typology Code available for payment source

## 2022-08-15 ENCOUNTER — Ambulatory Visit: Payer: No Typology Code available for payment source | Admitting: Urology

## 2022-08-16 ENCOUNTER — Ambulatory Visit: Payer: No Typology Code available for payment source | Admitting: Urology

## 2022-08-16 ENCOUNTER — Encounter: Payer: Self-pay | Admitting: Urology

## 2022-08-16 VITALS — BP 131/83 | HR 76 | Ht 74.0 in | Wt 217.0 lb

## 2022-08-16 DIAGNOSIS — N401 Enlarged prostate with lower urinary tract symptoms: Secondary | ICD-10-CM

## 2022-08-16 DIAGNOSIS — Z125 Encounter for screening for malignant neoplasm of prostate: Secondary | ICD-10-CM | POA: Diagnosis not present

## 2022-08-16 DIAGNOSIS — R339 Retention of urine, unspecified: Secondary | ICD-10-CM

## 2022-08-16 DIAGNOSIS — E291 Testicular hypofunction: Secondary | ICD-10-CM | POA: Diagnosis not present

## 2022-08-16 DIAGNOSIS — R6882 Decreased libido: Secondary | ICD-10-CM

## 2022-08-16 LAB — BLADDER SCAN AMB NON-IMAGING: Scan Result: 165

## 2022-08-16 MED ORDER — ALFUZOSIN HCL ER 10 MG PO TB24: 10.0000 mg | ORAL_TABLET | Freq: Every day | ORAL | 0 refills | Status: DC

## 2022-08-16 NOTE — Progress Notes (Signed)
Andrew Blair,acting as a Neurosurgeon for Andrew Altes, MD.,have documented all relevant documentation on the behalf of Andrew Altes, MD,as directed by  Andrew Altes, MD while in the presence of Andrew Altes, MD.  08/16/2022 10:58 AM   [name] 12-19-1965 [id number]  Chief Complaint  Patient presents with   Benign Prostatic Hypertrophy    HPI: Andrew Blair is a 57 y.o. male who presents for a 1 month follow-up visit  Gradual changes over the last few years with urinary urgency, hesitancy, slow stream, and post-void dribbling.  Symptoms are intermittent.  IPSS was 19/35 with most bothersome symptoms of weak and intermittent urinary stream. No dysuria, flank/abdominal/pelvic pain, gross hematuria No prior evaluation or treatment. He has had increased tiredness, fatigue, and decreased motivation.  Testosterone level checked in March 2019 was 273.  Interval History He did note significant improvement in his lower urinary tract symptoms on tamsulosin although he did experience ejaculatory dysfunction and was taking the medication intermittently. IPSS today 6/35 We had also discussed further evaluation of hypogonadism at his previous visit  PMH: Past Medical History:  Diagnosis Date   Allergy    Anxiety    Fatigue    discomfort from hemorrhoids leading to lack of sleep and fatigue    GERD (gastroesophageal reflux disease)    Substance abuse ([facility])    Thyroid disease    hypothyroid    Surgical History: Past Surgical History:  Procedure Laterality Date   HAND SURGERY  1984   SKIN SURGERY  1984   skin graph surgery on left hand     Home Medications:  Allergies as of 08/16/2022       Reactions   Cyclobenzaprine Other (See Comments)   Fainting, dizzy, lightheaded        Medication List        Accurate as of Aug 16, 2022 10:58 AM. If you have any questions, ask your nurse or doctor.          STOP taking these medications     dextromethorphan-guaiFENesin 30-600 MG 12hr tablet Commonly known as: MUCINEX DM Stopped by: Andrew Altes, MD   sulfamethoxazole-trimethoprim 800-160 MG tablet Commonly known as: BACTRIM DS Stopped by: Andrew Altes, MD       TAKE these medications    FISH OIL PO Take 2 capsules by mouth daily.   ibuprofen 600 MG tablet Commonly known as: ADVIL Take 1 tablet (600 mg total) by mouth every 6 (six) hours as needed for mild pain.   levothyroxine 200 MCG tablet Commonly known as: SYNTHROID Take 1 tablet (200 mcg total) by mouth daily before breakfast.   Magnesium 250 MG Tabs Take 250 mg by mouth daily.   tamsulosin 0.4 MG Caps capsule Commonly known as: FLOMAX Take 1 capsule (0.4 mg total) by mouth daily.   TURMERIC PO Take 1 capsule by mouth daily.   Vitamin D3 125 MCG (5000 UT) Caps Take 5,000 Units by mouth See admin instructions. Take 5,000 units by mouth once a day on Mon/Tues/Wed/Thurs/Fri/Sat   ZINC PO Take by mouth.        Allergies:  Allergies  Allergen Reactions   Cyclobenzaprine Other (See Comments)    Fainting, dizzy, lightheaded    Family History: Family History  Problem Relation Age of Onset   Hypertension Mother    Heart murmur Mother    Kidney disease Father        stones  Heart disease Father    Hyperlipidemia Father    Cancer Maternal Grandmother    Heart disease Maternal Grandfather    Heart disease Paternal Grandmother    Heart disease Paternal Grandfather     Social History:  reports that he has quit smoking. His smoking use included cigarettes. He has a 15.00 pack-year smoking history. He has quit using smokeless tobacco. He reports current alcohol use of about 3.0 standard drinks of alcohol per week. He reports that he does not use drugs.   Physical Exam: BP 131/83   Pulse 76   Ht 6\' 2"  (1.88 m)   Wt 217 lb (98.4 kg)   BMI 27.86 kg/m   Constitutional:  Alert and oriented, No acute distress. HEENT: [place]  AT Respiratory: Normal respiratory effort, no increased work of breathing. Skin: No rashes, bruises or suspicious lesions. Psychiatric: Normal mood and affect.  Assessment & Plan:    BPH with incomplete bladder emptying PVR today stable at 165 mL Ejaculatory dysfunction is bothersome and will give a trial of alfuzosin 10 mg daily; he will call back regarding efficacy 6 month follow up with PVR  Hypogonadism Testosterone, LH drawn today We discussed the diagnosis of testosterone is based on 2 abnormal total testosterone levels drawn in the a.m. admitted with signs and symptoms of low testosterone. We discussed various forms of testosterone placement including topical preparations, intramuscular injections, subcutaneous injections, subcutaneous pellet implantation and oral testosterone.  Pros and cons of each form were discussed.  The risk of transference of topical testosterone. I had an extensive discussion regarding testosterone replacement therapy including the following: Treatment may result in improvements in erectile function, low sex drive, anemia, bone mineral density, lean body mass, and depressive symptoms; evidence is inconclusive whether testosterone therapy improves cognitive function, measures of diabetes, energy, fatigue, lipid profiles, and quality of life measures; there is no conclusive evidence linking testosterone therapy to the development of prostate cancer; there is no definitive evidence linking testosterone therapy to a higher incidence of venothrombolic events; at the present time it cannot be stated definitively whether testosterone therapy increases or decreases the risk of cardiovascular events including myocardial infarction and stroke. Potential side effects were discussed including erythrocytosis, gynecomastia.  The need for regular monitoring of testosterone levels and hematocrit was discussed. We also discussed the use of Clomid to stimulate a native testosterone  production if his LH is in the normal range  Prostate cancer screening His last PSA was in 2022 and PSA was drawn today  I have reviewed the above documentation for accuracy and completeness, and I agree with the above.   Andrew Altes, MD  [facility] [address] [place], [place] [address] [phone]

## 2022-08-17 LAB — PSA: Prostate Specific Ag, Serum: 1.8 ng/mL (ref 0.0–4.0)

## 2022-08-17 LAB — LUTEINIZING HORMONE: LH: 2.6 m[IU]/mL (ref 1.7–8.6)

## 2022-08-17 LAB — TESTOSTERONE: Testosterone: 288 ng/dL (ref 264–916)

## 2022-08-20 ENCOUNTER — Telehealth: Payer: Self-pay | Admitting: *Deleted

## 2022-08-20 MED ORDER — CLOMIPHENE CITRATE 50 MG PO TABS: 25.0000 mg | ORAL_TABLET | Freq: Every day | ORAL | 0 refills | Status: DC

## 2022-08-20 NOTE — Telephone Encounter (Signed)
Patient would like Clomid 25mg , scheduled a appt for 7 weeks testosterone lab.

## 2022-08-20 NOTE — Addendum Note (Signed)
Addended by: Riki Altes on: 08/20/2022 04:09 PM   Modules accepted: Orders

## 2022-08-20 NOTE — Telephone Encounter (Signed)
-----   Message from Riki Altes, MD sent at 08/19/2022 11:27 AM EDT ----- Repeat testosterone level in low normal range at 288.  LH was low normal.  Since his testosterone levels are considered normal insurance would not cover testosterone replacement.  Recommend trial of Clomid 25 mg daily.  If he elects to start schedule lab visit for a testosterone level in 6 weeks

## 2022-08-23 ENCOUNTER — Telehealth: Payer: Self-pay

## 2022-08-23 MED ORDER — TESTOSTERONE 20.25 MG/ACT (1.62%) TD GEL: TRANSDERMAL | 1 refills | Status: DC

## 2022-08-23 NOTE — Telephone Encounter (Signed)
Return call to pt regarding RX questions.  Pt states Clomid was too expensive and he did not get from his pharmacy.  Stated he would like to try either the  testosterone gel or injections.  Said both options were less expensive than the Clomid and hopefully would be more effective.  Please advise.

## 2022-08-23 NOTE — Telephone Encounter (Signed)
Rx sent.  Since his levels were low normal unlikely will be covered by insurance

## 2022-10-10 ENCOUNTER — Other Ambulatory Visit: Payer: No Typology Code available for payment source

## 2022-10-10 DIAGNOSIS — R6882 Decreased libido: Secondary | ICD-10-CM

## 2022-10-11 LAB — TESTOSTERONE: Testosterone: 617 ng/dL (ref 264–916)

## 2022-10-16 ENCOUNTER — Encounter: Payer: Self-pay | Admitting: Urology

## 2022-10-16 ENCOUNTER — Other Ambulatory Visit: Payer: Self-pay | Admitting: Urology

## 2022-10-16 DIAGNOSIS — E079 Disorder of thyroid, unspecified: Secondary | ICD-10-CM

## 2022-10-16 MED ORDER — ALFUZOSIN HCL ER 10 MG PO TB24: 10.0000 mg | ORAL_TABLET | Freq: Every day | ORAL | 3 refills | Status: DC

## 2022-11-12 NOTE — Telephone Encounter (Signed)
Ok to refill as requested? Please sign off on Testosterone.

## 2022-11-14 ENCOUNTER — Other Ambulatory Visit: Payer: Self-pay | Admitting: Urology

## 2022-11-14 ENCOUNTER — Other Ambulatory Visit: Payer: Self-pay | Admitting: Nurse Practitioner

## 2022-11-14 DIAGNOSIS — E079 Disorder of thyroid, unspecified: Secondary | ICD-10-CM

## 2022-11-14 MED ORDER — ALFUZOSIN HCL ER 10 MG PO TB24: 10.0000 mg | ORAL_TABLET | Freq: Every day | ORAL | 3 refills | Status: DC

## 2023-01-08 ENCOUNTER — Telehealth: Payer: Self-pay | Admitting: *Deleted

## 2023-01-08 ENCOUNTER — Other Ambulatory Visit: Payer: Self-pay | Admitting: Family Medicine

## 2023-01-08 DIAGNOSIS — E782 Mixed hyperlipidemia: Secondary | ICD-10-CM

## 2023-01-08 DIAGNOSIS — R7989 Other specified abnormal findings of blood chemistry: Secondary | ICD-10-CM

## 2023-01-08 DIAGNOSIS — E079 Disorder of thyroid, unspecified: Secondary | ICD-10-CM

## 2023-01-08 NOTE — Telephone Encounter (Signed)
Tried to contact pt to inform him that provider has added other labs for his lab appointment on the 25th.  Pt should come fasting for this lab. I will also send a mychart message informing him of this.

## 2023-01-09 NOTE — Telephone Encounter (Signed)
Attempted to contact pt again, VM was full could not LVM.

## 2023-01-11 ENCOUNTER — Other Ambulatory Visit: Payer: No Typology Code available for payment source

## 2023-01-11 DIAGNOSIS — E079 Disorder of thyroid, unspecified: Secondary | ICD-10-CM

## 2023-01-11 DIAGNOSIS — R7989 Other specified abnormal findings of blood chemistry: Secondary | ICD-10-CM

## 2023-01-11 DIAGNOSIS — E782 Mixed hyperlipidemia: Secondary | ICD-10-CM

## 2023-01-12 LAB — COMPREHENSIVE METABOLIC PANEL
ALT: 23 [IU]/L (ref 0–44)
AST: 15 [IU]/L (ref 0–40)
Albumin: 4.4 g/dL (ref 3.8–4.9)
Alkaline Phosphatase: 46 [IU]/L (ref 44–121)
BUN/Creatinine Ratio: 14 (ref 9–20)
BUN: 16 mg/dL (ref 6–24)
Bilirubin Total: 0.5 mg/dL (ref 0.0–1.2)
CO2: 23 mmol/L (ref 20–29)
Calcium: 9.7 mg/dL (ref 8.7–10.2)
Chloride: 103 mmol/L (ref 96–106)
Creatinine, Ser: 1.11 mg/dL (ref 0.76–1.27)
Globulin, Total: 2.2 g/dL (ref 1.5–4.5)
Glucose: 99 mg/dL (ref 70–99)
Potassium: 4.2 mmol/L (ref 3.5–5.2)
Sodium: 141 mmol/L (ref 134–144)
Total Protein: 6.6 g/dL (ref 6.0–8.5)
eGFR: 78 mL/min/{1.73_m2} (ref >59)

## 2023-01-12 LAB — CBC WITH DIFFERENTIAL/PLATELET
Basophils Absolute: 0.1 10*3/uL (ref 0.0–0.2)
Basos: 1 %
EOS (ABSOLUTE): 0.1 10*3/uL (ref 0.0–0.4)
Eos: 3 %
Hematocrit: 45.3 % (ref 37.5–51.0)
Hemoglobin: 14.6 g/dL (ref 13.0–17.7)
Immature Grans (Abs): 0 10*3/uL (ref 0.0–0.1)
Immature Granulocytes: 0 %
Lymphocytes Absolute: 1.2 10*3/uL (ref 0.7–3.1)
Lymphs: 32 %
MCH: 29.9 pg (ref 26.6–33.0)
MCHC: 32.2 g/dL (ref 31.5–35.7)
MCV: 93 fL (ref 79–97)
Monocytes Absolute: 0.3 10*3/uL (ref 0.1–0.9)
Monocytes: 7 %
Neutrophils Absolute: 2.2 10*3/uL (ref 1.4–7.0)
Neutrophils: 57 %
Platelets: 174 10*3/uL (ref 150–450)
RBC: 4.88 x10E6/uL (ref 4.14–5.80)
RDW: 13.4 % (ref 11.6–15.4)
WBC: 3.9 10*3/uL (ref 3.4–10.8)

## 2023-01-12 LAB — TSH: TSH: 12.8 u[IU]/mL — ABNORMAL HIGH (ref 0.450–4.500)

## 2023-01-12 LAB — LIPID PANEL
Chol/HDL Ratio: 5 ratio (ref 0.0–5.0)
Cholesterol, Total: 277 mg/dL — ABNORMAL HIGH (ref 100–199)
HDL: 55 mg/dL (ref >39)
LDL Chol Calc (NIH): 178 mg/dL — ABNORMAL HIGH (ref 0–99)
Triglycerides: 231 mg/dL — ABNORMAL HIGH (ref 0–149)
VLDL Cholesterol Cal: 44 mg/dL — ABNORMAL HIGH (ref 5–40)

## 2023-01-15 NOTE — Telephone Encounter (Signed)
Pt read the message on 01/12/23.

## 2023-01-16 ENCOUNTER — Encounter: Payer: Self-pay | Admitting: Family Medicine

## 2023-01-16 ENCOUNTER — Ambulatory Visit: Payer: No Typology Code available for payment source | Admitting: Family Medicine

## 2023-01-16 VITALS — BP 120/81 | HR 72 | Ht 74.0 in | Wt 216.4 lb

## 2023-01-16 DIAGNOSIS — E079 Disorder of thyroid, unspecified: Secondary | ICD-10-CM | POA: Diagnosis not present

## 2023-01-16 DIAGNOSIS — E782 Mixed hyperlipidemia: Secondary | ICD-10-CM

## 2023-01-16 MED ORDER — LEVOTHYROXINE SODIUM 200 MCG PO TABS: 200.0000 ug | ORAL_TABLET | Freq: Every day | ORAL | 1 refills | Status: DC

## 2023-01-16 NOTE — Assessment & Plan Note (Signed)
Elevated LDL and total cholesterol.  ASCVD risk score is less than 7.5%.  Patient wishes to adjust diet and exercise.  Will recheck in approximately 6 to 8 weeks and continue discussion on whether patient should be on statin.

## 2023-01-16 NOTE — Patient Instructions (Signed)
It was nice to see you today,  We addressed the following topics today: -I have sent in a new prescription for your thyroid medicine.  Take this medicine first thing in the morning on an empty stomach with a full glass of water and do not eat anything for 30 minutes afterwards. - I will need to see you back in 8 weeks, at 7 weeks you can get your blood test drawn again. - To reduce your cholesterol, you should maintain a healthy weight, exercise regularly, and avoid or limit saturated fats in your diet which are found in meats and dairy.  Have a great day,  Frederic Jericho, MD

## 2023-01-16 NOTE — Assessment & Plan Note (Signed)
Off medication for 2 months.  Explains his elevated TSH.  Symptoms including fatigue and word finding difficulty.  Patient has been taking 200 mcg for at least a few years.  We discussed proper way to take the medication on an empty stomach with full glass of water and waiting until eating afterwards. - 200 mcg levothyroxine - Recheck TSH in approximately 6 to 8 weeks.

## 2023-01-16 NOTE — Progress Notes (Signed)
  Established Patient Office Visit  Subjective   Patient ID: Andrew Blair, male    DOB: Jun 20, 1965  Age: 57 y.o. MRN: [id number]  Chief Complaint  Patient presents with   Medical Management of Chronic Issues    HPI  Hypothyroid-patient has not taken his thyroid medication in a few months due to running out of it.  He was previously getting it from his urologist for a brief period of time.  Prior to that he was getting up from his PCP.  He did not initially notice any symptoms when stopping but later on developed fatigue, word finding difficulties, brain fog like symptoms.  Hyperlipidemia-we discussed patient's ASCVD risk percentage, his cholesterol levels and pharmacologic and nonpharmacologic ways of improving his cholesterol levels and reducing risk of heart attack and stroke.  Patient wishes to improve his diet at this time and recheck at our next visit.  The 10-year ASCVD risk score (Arnett DK, et al., 2019) is: 7.2%  Health Maintenance Due  Topic Date Due   Colonoscopy  Never done   Zoster Vaccines- Shingrix (1 of 2) Never done   COVID-19 Vaccine (3 - 2023-24 season) 11/18/2022   DTaP/Tdap/Td (2 - Td or Tdap) 12/16/2022      Objective:     BP 120/81   Pulse 72   Ht 6\' 2"  (1.88 m)   Wt 216 lb 6.4 oz (98.2 kg)   SpO2 99%   BMI 27.78 kg/m    Physical Exam General: Alert, oriented CV: Regular rate and rhythm Pulmonary: Lungs clear bilaterally Psych: Pleasant affect   No results found for any visits on 01/16/23.      Assessment & Plan:   Thyroid disease Assessment & Plan: Off medication for 2 months.  Explains his elevated TSH.  Symptoms including fatigue and word finding difficulty.  Patient has been taking 200 mcg for at least a few years.  We discussed proper way to take the medication on an empty stomach with full glass of water and waiting until eating afterwards. - 200 mcg levothyroxine - Recheck TSH in approximately 6 to 8 weeks.  Orders: -      Levothyroxine Sodium; Take 1 tablet (200 mcg total) by mouth daily before breakfast.  Dispense: 90 tablet; Refill: 1 -     TSH; Future  Mixed hyperlipidemia Assessment & Plan: Elevated LDL and total cholesterol.  ASCVD risk score is less than 7.5%.  Patient wishes to adjust diet and exercise.  Will recheck in approximately 6 to 8 weeks and continue discussion on whether patient should be on statin.  Orders: -     Hemoglobin A1c; Future -     Lipid panel; Future -     Apolipoprotein B; Future     Return in about 8 weeks (around 03/13/2023) for hld, TSH.    Sandre Kitty, MD

## 2023-02-11 ENCOUNTER — Encounter: Payer: Self-pay | Admitting: Urology

## 2023-02-11 ENCOUNTER — Ambulatory Visit: Payer: No Typology Code available for payment source | Admitting: Urology

## 2023-02-11 VITALS — BP 114/74 | HR 79 | Ht 74.0 in | Wt 220.0 lb

## 2023-02-11 DIAGNOSIS — E291 Testicular hypofunction: Secondary | ICD-10-CM | POA: Diagnosis not present

## 2023-02-11 DIAGNOSIS — R3915 Urgency of urination: Secondary | ICD-10-CM | POA: Diagnosis not present

## 2023-02-11 DIAGNOSIS — N401 Enlarged prostate with lower urinary tract symptoms: Secondary | ICD-10-CM | POA: Diagnosis not present

## 2023-02-11 LAB — BLADDER SCAN AMB NON-IMAGING: Scan Result: 46

## 2023-02-11 MED ORDER — ALFUZOSIN HCL ER 10 MG PO TB24: 10.0000 mg | ORAL_TABLET | Freq: Every day | ORAL | 3 refills | Status: DC

## 2023-02-11 NOTE — Progress Notes (Signed)
I, Maysun Anabel Bene, acting as a scribe for Riki Altes, MD., have documented all relevant documentation on the behalf of Riki Altes, MD, as directed by Riki Altes, MD while in the presence of Riki Altes, MD.  02/11/2023 11:16 AM   Andrew Blair [birth year] [id number]  Chief Complaint  Patient presents with   Benign Prostatic Hypertrophy   Urologic history 1. BPH with LUTS Initially seen April 2024 with severe LUTS; IPSS 19/35, PVR 172.  Bothersome ejaculatory dysfunction with tamsulosin and follow-up PVR May 2024 165.  2. Hypogonadism Low normal testosterone level <300 with symptoms of tiredness, fatigue, and decreased libido.  LH normal and initial trial of Clomid could not tolerate secondary headache.  Started on 1.62% testosterone gel.  HPI: Andrew Blair is a 57 y.o. male presents for follow-up visit.  Since his last visit, he has noted marked improvement in his lower urinary tract symptoms on alfuzosin and having no bothersome side effects.  He also has had significant improvement in his energy level and overall feeling of general well-being on testosterone gel.  Testosterone level July 2024, was 617 Recent H/H with his PCP 01/11/23 was 14.6/45.3   PMH: Past Medical History:  Diagnosis Date   Allergy    Anxiety    Fatigue    discomfort from hemorrhoids leading to lack of sleep and fatigue    GERD (gastroesophageal reflux disease)    Substance abuse (HCC)    Thyroid disease    hypothyroid    Surgical History: Past Surgical History:  Procedure Laterality Date   HAND SURGERY  1984   SKIN SURGERY  1984   skin graph surgery on left hand     Home Medications:  Allergies as of 02/11/2023       Reactions   Cyclobenzaprine Other (See Comments)   Fainting, dizzy, lightheaded        Medication List        Accurate as of February 11, 2023 11:16 AM. If you have any questions, ask your nurse or doctor.          alfuzosin  10 MG 24 hr tablet Commonly known as: UROXATRAL Take 1 tablet (10 mg total) by mouth daily with breakfast.   FISH OIL PO Take 2 capsules by mouth daily.   levothyroxine 200 MCG tablet Commonly known as: SYNTHROID Take 1 tablet (200 mcg total) by mouth daily before breakfast.   Magnesium 250 MG Tabs Take 250 mg by mouth daily.   Testosterone 1.62 % Gel APPLY 1 PUMP ONCE DAILY TO EACH SHOULDER   TURMERIC PO Take 1 capsule by mouth daily.   Vitamin D3 125 MCG (5000 UT) Caps Take 5,000 Units by mouth See admin instructions. Take 5,000 units by mouth once a day on Mon/Tues/Wed/Thurs/Fri/Sat   ZINC PO Take by mouth.        Allergies:  Allergies  Allergen Reactions   Cyclobenzaprine Other (See Comments)    Fainting, dizzy, lightheaded    Family History: Family History  Problem Relation Age of Onset   Hypertension Mother    Heart murmur Mother    Kidney disease Father        stones   Heart disease Father    Hyperlipidemia Father    Cancer Maternal Grandmother    Heart disease Maternal Grandfather    Heart disease Paternal Grandmother    Heart disease Paternal Grandfather     Social History:  reports that he has quit  smoking. His smoking use included cigarettes. He has a 15 pack-year smoking history. He has quit using smokeless tobacco. He reports current alcohol use of about 3.0 standard drinks of alcohol per week. He reports that he does not use drugs.   Physical Exam: BP 114/74   Pulse 79   Ht 6\' 2"  (1.88 m)   Wt 220 lb (99.8 kg)   BMI 28.25 kg/m   Constitutional:  Alert and oriented, No acute distress. HEENT: [place] AT Respiratory: Normal respiratory effort, no increased work of breathing. Psychiatric: Normal mood and affect.   Assessment & Plan:    1. BPH with LUTS Marked improvement on alfuzosin 10 mg daily  PVR today 46 mL   2. Hypogonadism Overall symptom improvement on testosterone gel.  Recheck testosterone level and PSA today. Is stable  Lab  visit 6 months testosterone, H/H.  1 year office visit testosterone, H/H, PSA and DRE.   I have reviewed the above documentation for accuracy and completeness, and I agree with the above.   Riki Altes, MD  [facility] [address], [address] [place], [place] [address] [phone]

## 2023-02-12 LAB — HEMOGLOBIN AND HEMATOCRIT, BLOOD
Hematocrit: 44.5 % (ref 37.5–51.0)
Hemoglobin: 14.5 g/dL (ref 13.0–17.7)

## 2023-02-12 LAB — TESTOSTERONE: Testosterone: 453 ng/dL (ref 264–916)

## 2023-02-12 LAB — PSA: Prostate Specific Ag, Serum: 1.7 ng/mL (ref 0.0–4.0)

## 2023-03-08 ENCOUNTER — Other Ambulatory Visit: Payer: No Typology Code available for payment source

## 2023-03-08 DIAGNOSIS — E782 Mixed hyperlipidemia: Secondary | ICD-10-CM

## 2023-03-08 DIAGNOSIS — E079 Disorder of thyroid, unspecified: Secondary | ICD-10-CM

## 2023-03-09 LAB — LIPID PANEL
Chol/HDL Ratio: 4.8 {ratio} (ref 0.0–5.0)
Cholesterol, Total: 275 mg/dL — ABNORMAL HIGH (ref 100–199)
HDL: 57 mg/dL (ref >39)
LDL Chol Calc (NIH): 180 mg/dL — ABNORMAL HIGH (ref 0–99)
Triglycerides: 206 mg/dL — ABNORMAL HIGH (ref 0–149)
VLDL Cholesterol Cal: 38 mg/dL (ref 5–40)

## 2023-03-09 LAB — APOLIPOPROTEIN B: Apolipoprotein B: 122 mg/dL — ABNORMAL HIGH (ref <90)

## 2023-03-09 LAB — HEMOGLOBIN A1C
Est. average glucose Bld gHb Est-mCnc: 117 mg/dL
Hgb A1c MFr Bld: 5.7 % — ABNORMAL HIGH (ref 4.8–5.6)

## 2023-03-09 LAB — TSH: TSH: 7.17 u[IU]/mL — ABNORMAL HIGH (ref 0.450–4.500)

## 2023-03-14 ENCOUNTER — Other Ambulatory Visit: Payer: Self-pay | Admitting: Family Medicine

## 2023-03-14 ENCOUNTER — Telehealth: Payer: Self-pay

## 2023-03-14 MED ORDER — ATORVASTATIN CALCIUM 40 MG PO TABS: 40.0000 mg | ORAL_TABLET | Freq: Every day | ORAL | 1 refills | Status: AC

## 2023-03-14 NOTE — Telephone Encounter (Signed)
Please let the patient know I sent in 40mg  of atorvastatin.  He should take a half a tablet a day for the first two weeks and then increase to a full tablet daily.    Please tell him I would like him to only take the levothyroxine first thing in the morning 30 minutes before he eats. Taking it after can lead to absorption issues even if he waits 30 minutes.

## 2023-03-14 NOTE — Telephone Encounter (Signed)
Called pt he is advised of his medication and recommendation

## 2023-03-14 NOTE — Telephone Encounter (Signed)
Pt is ok starting Atorvastatin. Pt did mention that he has some goals for 2025 that hopes that he won't have to be on the Statin long term.   Pt states [facility] is the pharmacy of choice.   Pt reports that he has been taking the levothyroxine daily either taking it 30 mins before eating or 30 mins after eating. Also states that he may have only missed 2 doses in 30days. Pt says that he doesn't want to increase over .

## 2023-03-15 ENCOUNTER — Ambulatory Visit: Payer: No Typology Code available for payment source | Admitting: Family Medicine

## 2023-03-18 ENCOUNTER — Other Ambulatory Visit: Payer: Self-pay | Admitting: Urology

## 2023-04-15 ENCOUNTER — Ambulatory Visit: Payer: No Typology Code available for payment source | Admitting: Family Medicine

## 2023-04-15 NOTE — Progress Notes (Deleted)
   Established Patient Office Visit  Subjective   Patient ID: Andrew Blair, male    DOB: 17-Sep-1965  Age: 58 y.o. MRN: [id number]  No chief complaint on file.   HPI  Hyperlipidemia-atorvastatin?  Needs repeat of his  Hypothyroid-recheck?  200 mcg?  Colonoscopy-never done?   The 10-year ASCVD risk score (Arnett DK, et al., 2019) is: 6.4%  Health Maintenance Due  Topic Date Due   Colonoscopy  Never done   Zoster Vaccines- Shingrix (1 of 2) Never done   COVID-19 Vaccine (3 - 2024-25 season) 11/18/2022   DTaP/Tdap/Td (2 - Td or Tdap) 12/16/2022      Objective:     There were no vitals taken for this visit. {Vitals History (Optional):23777}  Physical Exam   No results found for any visits on 04/15/23.      Assessment & Plan:   There are no diagnoses linked to this encounter.   No follow-ups on file.    Sandre Kitty, MD

## 2023-07-31 ENCOUNTER — Other Ambulatory Visit: Payer: Self-pay | Admitting: Urology

## 2023-08-13 ENCOUNTER — Other Ambulatory Visit: Payer: Self-pay

## 2023-08-13 DIAGNOSIS — E291 Testicular hypofunction: Secondary | ICD-10-CM

## 2023-08-14 ENCOUNTER — Ambulatory Visit: Payer: Self-pay | Admitting: Urology

## 2023-08-14 LAB — HEMOGLOBIN AND HEMATOCRIT, BLOOD
Hematocrit: 45.8 % (ref 37.5–51.0)
Hemoglobin: 15.5 g/dL (ref 13.0–17.7)

## 2023-08-14 LAB — TESTOSTERONE: Testosterone: 433 ng/dL (ref 264–916)

## 2023-12-16 ENCOUNTER — Other Ambulatory Visit: Payer: Self-pay | Admitting: Urology

## 2023-12-16 ENCOUNTER — Other Ambulatory Visit: Payer: Self-pay | Admitting: Family Medicine

## 2023-12-16 DIAGNOSIS — E079 Disorder of thyroid, unspecified: Secondary | ICD-10-CM

## 2023-12-19 ENCOUNTER — Other Ambulatory Visit: Payer: Self-pay | Admitting: Family Medicine

## 2023-12-19 ENCOUNTER — Telehealth: Payer: Self-pay

## 2023-12-19 DIAGNOSIS — E079 Disorder of thyroid, unspecified: Secondary | ICD-10-CM

## 2023-12-19 MED ORDER — ALFUZOSIN HCL ER 10 MG PO TB24: 10.0000 mg | ORAL_TABLET | Freq: Every day | ORAL | 0 refills | Status: DC

## 2023-12-19 MED ORDER — LEVOTHYROXINE SODIUM 200 MCG PO TABS: 200.0000 ug | ORAL_TABLET | Freq: Every day | ORAL | 0 refills | Status: DC

## 2023-12-19 NOTE — Telephone Encounter (Signed)
 ok

## 2023-12-19 NOTE — Telephone Encounter (Signed)
 Copied from CRM [id number]. Topic: Appointments - Appointment Scheduling >> Dec 19, 2023  1:43 PM Tiffini S wrote: Patient scheduled appointment for 12/31/23- asked for a refill to be sent to the pharmacy for 10 tablets until the appointment date levothyroxine  (SYNTHROID ) 200 MCG tablet alfuzosin  (UROXATRAL ) 10 MG 24 hr tablet to  [facility] [place], [place]

## 2023-12-19 NOTE — Telephone Encounter (Signed)
 I just sent in 30 day supplies

## 2023-12-31 ENCOUNTER — Ambulatory Visit: Admitting: Family Medicine

## 2023-12-31 ENCOUNTER — Encounter: Payer: Self-pay | Admitting: Family Medicine

## 2023-12-31 VITALS — BP 106/72 | HR 79 | Ht 74.0 in | Wt 219.0 lb

## 2023-12-31 DIAGNOSIS — E782 Mixed hyperlipidemia: Secondary | ICD-10-CM

## 2023-12-31 DIAGNOSIS — N401 Enlarged prostate with lower urinary tract symptoms: Secondary | ICD-10-CM

## 2023-12-31 DIAGNOSIS — Z87898 Personal history of other specified conditions: Secondary | ICD-10-CM | POA: Diagnosis not present

## 2023-12-31 DIAGNOSIS — Z1212 Encounter for screening for malignant neoplasm of rectum: Secondary | ICD-10-CM | POA: Diagnosis not present

## 2023-12-31 DIAGNOSIS — Z0000 Encounter for general adult medical examination without abnormal findings: Secondary | ICD-10-CM

## 2023-12-31 DIAGNOSIS — E079 Disorder of thyroid, unspecified: Secondary | ICD-10-CM

## 2023-12-31 DIAGNOSIS — Z1211 Encounter for screening for malignant neoplasm of colon: Secondary | ICD-10-CM | POA: Diagnosis not present

## 2023-12-31 DIAGNOSIS — R7989 Other specified abnormal findings of blood chemistry: Secondary | ICD-10-CM

## 2023-12-31 DIAGNOSIS — R4189 Other symptoms and signs involving cognitive functions and awareness: Secondary | ICD-10-CM | POA: Insufficient documentation

## 2023-12-31 DIAGNOSIS — N400 Enlarged prostate without lower urinary tract symptoms: Secondary | ICD-10-CM | POA: Insufficient documentation

## 2023-12-31 NOTE — Assessment & Plan Note (Signed)
-   On levothyroxine  200 mcg. Reports intermittent adherence. - Plan:  - Check TSH.  - Continue levothyroxine  200 mcg daily. Refill sent.

## 2023-12-31 NOTE — Assessment & Plan Note (Signed)
-   Follows with Urology. Next appointment in November. On testosterone  gel

## 2023-12-31 NOTE — Assessment & Plan Note (Signed)
-   Reports word-finding difficulty over the past 2 years. Father has dementia, increasing personal concern. No objective deficits noted by others.  - Counseled on age-related cognitive changes versus formal cognitive impairment/dementia. Discussed that formal neurocognitive testing is available if concerns persist or worsen, or if noted by others.  - Discussed importance of managing vascular risk factors (e.g., cholesterol) to prevent vascular dementia.

## 2023-12-31 NOTE — Progress Notes (Signed)
 Established Patient Office Visit  Subjective   Patient ID: Andrew Blair, male    DOB: 03-May-1965  Age: 58 y.o. MRN: [id number]  Chief Complaint  Patient presents with   Medical Management of Chronic Issues    HPI  Subjective - Requests referral for first screening colonoscopy. Overdue. No prior Cologuard. Denies family history of colon cancer. Denies bleeding or other bowel issues. Reports regular, routine bowel movements. - Quit vaping recently after 13-18 years (previously smoked). Reports chest congestion and productive cough of thick sputum since quitting, starting in the second week of cessation. - Reports subjective word-finding difficulties for the last two years. Concerned about this as father has dementia. Reports no other cognitive issues. - Reports hip and knee pain when weight is over 220 lbs, which resolves when weight is below 215 lbs. Takes ibuprofen  as needed for pain. - Reports good energy levels, clarity, and improved sleep on testosterone  therapy. - Reports generally feeling healthy.  Medications: Levothyroxine  200 mcg, reports missing doses occasionally due to travel and forgetting. Alfuzosin . Testosterone , reports benefits to energy and mental clarity. Stopped Lipitor due to flushing. Currently taking over-the-counter niacin for cholesterol, as well as turmeric, zinc, magnesium, and vitamin D3.  PMH: Hypothyroidism, hyperlipidemia. PSH: None mentioned. FH: Mother has hypothyroidism. Father (age 20) has dementia. No known family history of colon cancer. Social Hx: Former smoker, recently quit vaping (vaped for 13-18 years). Reports healthy diet with home-cooked meals, eats from garden. Does not eat fast food. Stays physically active.  ROS: Constitutional: Denies fatigue. GI: Denies bleeding, constipation, diarrhea. Reports regular bowel habits. Respiratory: Productive cough and congestion since quitting vaping. Denies dyspnea. MSK: Intermittent hip and knee  pain related to weight. Neuro: Reports subjective word-finding difficulty. Denies other neurological symptoms. Psychiatric: Denies mood changes.   The 10-year ASCVD risk score (Arnett DK, et al., 2019) is: 6.1%  Health Maintenance Due  Topic Date Due   Hepatitis B Vaccines 19-59 Average Risk (1 of 3 - 19+ 3-dose series) Never done   Colonoscopy  Never done   Pneumococcal Vaccine: 50+ Years (1 of 1 - PCV) Never done   Zoster Vaccines- Shingrix (1 of 2) Never done   DTaP/Tdap/Td (2 - Td or Tdap) 12/16/2022   Influenza Vaccine  Never done   COVID-19 Vaccine (3 - 2025-26 season) 11/18/2023      Objective:     BP 106/72   Pulse 79   Ht 6' 2 (1.88 m)   Wt 219 lb (99.3 kg)   SpO2 98%   BMI 28.12 kg/m    Physical Exam Gen: alert, oriented HEENT: [place], [place]omi, mmm CV: rrr, no murmur Pulm: lctab. No wheeze or crackles.  GI: soft, nbs.  Nontender to palpation MSK: strength equal b/l. Normal gait Ext: no pedal edema Skin: warm and dry, no rashes Psych: pleasant affect.  Spontaneous speech   No results found for any visits on 12/31/23.      Assessment & Plan:   Encounter for colorectal cancer screening -     Ambulatory referral to Gastroenterology  Hypothyroidism Assessment & Plan: - On levothyroxine  200 mcg. Reports intermittent adherence. - Plan:  - Check TSH.  - Continue levothyroxine  200 mcg daily. Refill sent.   Mixed hyperlipidemia Assessment & Plan: - History of hyperlipidemia. Stopped Lipitor due to flushing side effect. Self-treating with niacin. - Plan:  - Check lipid panel.  - Counseled on niacin: while it can lower cholesterol numbers, studies have not shown a benefit in reducing  cardiovascular events (heart attack, stroke, death).  - Discussed alternative non-statin options if needed after labs return, including fiber (Metamucil), CoQ10, red yeast rice, plant sterols (Smart Balance), and prescription options like Zetia or Repatha.  - Discussed that  statin-related side effects can often be managed by trying a different statin or a lower dose.  - Will follow up with results and further recommendations via patient portal.   Low testosterone  in male Assessment & Plan: - Follows with Urology. Next appointment in November. On testosterone  gel   Benign prostatic hyperplasia with lower urinary tract symptoms, symptom details unspecified Assessment & Plan: Continue alfuzosin    History of vaping Assessment & Plan: - Long history of smoking and then vaping. Quit recently. Now with productive cough.  - Continue abstinence from vaping/smoking.    Concern about memory Assessment & Plan: - Reports word-finding difficulty over the past 2 years. Father has dementia, increasing personal concern. No objective deficits noted by others.  - Counseled on age-related cognitive changes versus formal cognitive impairment/dementia. Discussed that formal neurocognitive testing is available if concerns persist or worsen, or if noted by others.  - Discussed importance of managing vascular risk factors (e.g., cholesterol) to prevent vascular dementia.      Return in about 1 year (around 12/30/2024) for physical.    Toribio MARLA Slain, MD

## 2023-12-31 NOTE — Patient Instructions (Signed)
 It was nice to see you today,  We addressed the following topics today: - Schedule labs for later this week, fasting. - Lab order will include CBC, CMP, Lipid panel, TSH, HbA1c, Testosterone , and PSA. - Referral for screening colonoscopy will be placed. Someone will call you to schedule this - Continue current medications as prescribed. - I will send a message with lab results and further recommendations.  Have a great day,  Rolan Slain, MD

## 2023-12-31 NOTE — Assessment & Plan Note (Signed)
-   Long history of smoking and then vaping. Quit recently. Now with productive cough.  - Continue abstinence from vaping/smoking.

## 2023-12-31 NOTE — Assessment & Plan Note (Signed)
Continue alfuzosin

## 2023-12-31 NOTE — Assessment & Plan Note (Signed)
-   History of hyperlipidemia. Stopped Lipitor due to flushing side effect. Self-treating with niacin. - Plan:  - Check lipid panel.  - Counseled on niacin: while it can lower cholesterol numbers, studies have not shown a benefit in reducing cardiovascular events (heart attack, stroke, death).  - Discussed alternative non-statin options if needed after labs return, including fiber (Metamucil), CoQ10, red yeast rice, plant sterols (Smart Balance), and prescription options like Zetia or Repatha.  - Discussed that statin-related side effects can often be managed by trying a different statin or a lower dose.  - Will follow up with results and further recommendations via patient portal.

## 2024-01-02 ENCOUNTER — Other Ambulatory Visit

## 2024-01-03 ENCOUNTER — Other Ambulatory Visit

## 2024-01-03 LAB — CBC WITH DIFFERENTIAL/PLATELET
Basophils Absolute: 0 x10E3/uL (ref 0.0–0.2)
Basos: 1 %
EOS (ABSOLUTE): 0.2 x10E3/uL (ref 0.0–0.4)
Eos: 4 %
Hematocrit: 42.8 % (ref 37.5–51.0)
Hemoglobin: 14.6 g/dL (ref 13.0–17.7)
Immature Grans (Abs): 0 x10E3/uL (ref 0.0–0.1)
Immature Granulocytes: 0 %
Lymphocytes Absolute: 1.2 x10E3/uL (ref 0.7–3.1)
Lymphs: 29 %
MCH: 31.1 pg (ref 26.6–33.0)
MCHC: 34.1 g/dL (ref 31.5–35.7)
MCV: 91 fL (ref 79–97)
Monocytes Absolute: 0.4 x10E3/uL (ref 0.1–0.9)
Monocytes: 9 %
Neutrophils Absolute: 2.4 x10E3/uL (ref 1.4–7.0)
Neutrophils: 56 %
Platelets: 199 x10E3/uL (ref 150–450)
RBC: 4.7 x10E6/uL (ref 4.14–5.80)
RDW: 13.5 % (ref 11.6–15.4)
WBC: 4.1 x10E3/uL (ref 3.4–10.8)

## 2024-01-03 LAB — TSH: TSH: 3.12 u[IU]/mL (ref 0.450–4.500)

## 2024-01-03 LAB — COMPREHENSIVE METABOLIC PANEL WITH GFR
ALT: 27 IU/L (ref 0–44)
AST: 22 IU/L (ref 0–40)
Albumin: 4.5 g/dL (ref 3.8–4.9)
Alkaline Phosphatase: 49 IU/L (ref 47–123)
BUN/Creatinine Ratio: 17 (ref 9–20)
BUN: 18 mg/dL (ref 6–24)
Bilirubin Total: 0.5 mg/dL (ref 0.0–1.2)
CO2: 23 mmol/L (ref 20–29)
Calcium: 9.5 mg/dL (ref 8.7–10.2)
Chloride: 103 mmol/L (ref 96–106)
Creatinine, Ser: 1.03 mg/dL (ref 0.76–1.27)
Globulin, Total: 2.1 g/dL (ref 1.5–4.5)
Glucose: 96 mg/dL (ref 70–99)
Potassium: 4.4 mmol/L (ref 3.5–5.2)
Sodium: 141 mmol/L (ref 134–144)
Total Protein: 6.6 g/dL (ref 6.0–8.5)
eGFR: 85 mL/min/1.73 (ref >59)

## 2024-01-03 LAB — TESTOSTERONE: Testosterone: >1500 ng/dL — ABNORMAL HIGH (ref 264–916)

## 2024-01-03 LAB — LIPID PANEL
Chol/HDL Ratio: 4.3 ratio (ref 0.0–5.0)
Cholesterol, Total: 226 mg/dL — ABNORMAL HIGH (ref 100–199)
HDL: 53 mg/dL (ref >39)
LDL Chol Calc (NIH): 149 mg/dL — ABNORMAL HIGH (ref 0–99)
Triglycerides: 135 mg/dL (ref 0–149)
VLDL Cholesterol Cal: 24 mg/dL (ref 5–40)

## 2024-01-03 LAB — HEMOGLOBIN A1C
Est. average glucose Bld gHb Est-mCnc: 111 mg/dL
Hgb A1c MFr Bld: 5.5 % (ref 4.8–5.6)

## 2024-01-03 LAB — PSA: Prostate Specific Ag, Serum: 5.5 ng/mL — ABNORMAL HIGH (ref 0.0–4.0)

## 2024-01-08 ENCOUNTER — Ambulatory Visit: Payer: Self-pay | Admitting: Family Medicine

## 2024-01-08 NOTE — Progress Notes (Signed)
 Called pt Andrew Blair to contact the office if pt calls please advised

## 2024-01-09 ENCOUNTER — Other Ambulatory Visit: Payer: Self-pay | Admitting: Family Medicine

## 2024-01-09 DIAGNOSIS — R7989 Other specified abnormal findings of blood chemistry: Secondary | ICD-10-CM

## 2024-01-09 DIAGNOSIS — R972 Elevated prostate specific antigen [PSA]: Secondary | ICD-10-CM

## 2024-01-15 ENCOUNTER — Other Ambulatory Visit: Payer: Self-pay | Admitting: Urology

## 2024-01-23 ENCOUNTER — Other Ambulatory Visit: Payer: Self-pay | Admitting: Family Medicine

## 2024-01-23 DIAGNOSIS — E079 Disorder of thyroid, unspecified: Secondary | ICD-10-CM

## 2024-02-10 ENCOUNTER — Other Ambulatory Visit: Payer: Self-pay

## 2024-02-10 DIAGNOSIS — E291 Testicular hypofunction: Secondary | ICD-10-CM

## 2024-02-11 ENCOUNTER — Other Ambulatory Visit: Payer: Self-pay

## 2024-02-11 DIAGNOSIS — R972 Elevated prostate specific antigen [PSA]: Secondary | ICD-10-CM

## 2024-02-11 DIAGNOSIS — E291 Testicular hypofunction: Secondary | ICD-10-CM

## 2024-02-12 ENCOUNTER — Encounter: Payer: Self-pay | Admitting: Urology

## 2024-02-12 ENCOUNTER — Ambulatory Visit: Payer: Self-pay | Admitting: Urology

## 2024-02-12 ENCOUNTER — Ambulatory Visit: Payer: Self-pay | Admitting: Family Medicine

## 2024-02-12 VITALS — BP 113/76 | HR 74 | Ht 75.0 in | Wt 220.0 lb

## 2024-02-12 DIAGNOSIS — N401 Enlarged prostate with lower urinary tract symptoms: Secondary | ICD-10-CM | POA: Diagnosis not present

## 2024-02-12 DIAGNOSIS — E291 Testicular hypofunction: Secondary | ICD-10-CM

## 2024-02-12 DIAGNOSIS — R339 Retention of urine, unspecified: Secondary | ICD-10-CM | POA: Diagnosis not present

## 2024-02-12 DIAGNOSIS — N529 Male erectile dysfunction, unspecified: Secondary | ICD-10-CM | POA: Diagnosis not present

## 2024-02-12 DIAGNOSIS — N489 Disorder of penis, unspecified: Secondary | ICD-10-CM

## 2024-02-12 LAB — PSA: Prostate Specific Ag, Serum: 4.1 ng/mL — ABNORMAL HIGH (ref 0.0–4.0)

## 2024-02-12 LAB — HEMOGLOBIN AND HEMATOCRIT, BLOOD
Hematocrit: 44.3 % (ref 37.5–51.0)
Hemoglobin: 14.8 g/dL (ref 13.0–17.7)

## 2024-02-12 LAB — BLADDER SCAN AMB NON-IMAGING

## 2024-02-12 LAB — TESTOSTERONE: Testosterone: 693 ng/dL (ref 264–916)

## 2024-02-12 MED ORDER — TADALAFIL 20 MG PO TABS: ORAL_TABLET | ORAL | 0 refills | Status: AC

## 2024-02-12 MED ORDER — ALFUZOSIN HCL ER 10 MG PO TB24: 10.0000 mg | ORAL_TABLET | Freq: Every day | ORAL | 0 refills | Status: AC

## 2024-02-12 NOTE — Progress Notes (Signed)
 02/12/2024 8:58 AM   Andrew Blair 1965-11-04 [id number]  Referring provider: Chandra Toribio POUR, MD [address] [place],  [place] [address]  Chief Complaint  Patient presents with   Hypogonadism    Urologic history 1. BPH with LUTS Initially seen April 2024 with severe LUTS; IPSS 19/35, PVR 172.  Bothersome ejaculatory dysfunction with tamsulosin  and follow-up PVR May 2024 165.   2. Hypogonadism Low normal testosterone  level <300 with symptoms of tiredness, fatigue, and decreased libido.  LH normal and initial trial of Clomid  could not tolerate secondary headache.  Started on 1.62% testosterone  gel.   HPI: Andrew Blair is a 58 y.o. male presents for annual follow-up  Stable lower urinary tract symptoms.  He does fine when he misses a dose of alfuzosin  he does have worsening voiding symptoms Since last visit he has noted some difficulty maintaining an erection but is able to achieve a firm erection without problems He has noted a lesion on his penile shaft that he wanted to have checked PCP checked his testosterone  level last month which was >1500.  He states he applied the gel in the region of his antecubital fossa that day and thinks it was a lab error.  His PSA was 5.5 Family history prostate cancer in his father Labs 02/11/2024: Testosterone  693 ng/dL; H/H 85.1/55.6, PSA was 5.5 on 01/02/2024 and repeated 02/11/2024 and was 4.1.  Baseline PSA is in the upper 1 range.  PMH: Past Medical History:  Diagnosis Date   Allergy    Anxiety    Fatigue    discomfort from hemorrhoids leading to lack of sleep and fatigue    GERD (gastroesophageal reflux disease)    Substance abuse (HCC)    Thyroid  disease    hypothyroid    Surgical History: Past Surgical History:  Procedure Laterality Date   HAND SURGERY  1984   SKIN SURGERY  1984   skin graph surgery on left hand     Home Medications:  Allergies as of 02/12/2024       Reactions   Cyclobenzaprine  Other  (See Comments)   Fainting, dizzy, lightheaded        Medication List        Accurate as of February 12, 2024  8:58 AM. If you have any questions, ask your nurse or doctor.          alfuzosin  10 MG 24 hr tablet Commonly known as: UROXATRAL  Take 1 tablet (10 mg total) by mouth daily with breakfast.   atorvastatin  40 MG tablet Commonly known as: LIPITOR Take 1 tablet (40 mg total) by mouth daily.   FISH OIL PO Take 2 capsules by mouth daily.   levothyroxine  200 MCG tablet Commonly known as: SYNTHROID  TAKE 1 TABLET (200 MCG TOTAL) BY MOUTH DAILY BEFORE BREAKFAST.   Magnesium 250 MG Tabs Take 250 mg by mouth daily.   Testosterone  1.62 % Gel APPLY 1 PUMP ONCE DAILY TO EACH SHOULDER   TURMERIC PO Take 1 capsule by mouth daily.   Vitamin D3 125 MCG (5000 UT) Caps Take 5,000 Units by mouth See admin instructions. Take 5,000 units by mouth once a day on Mon/Tues/Wed/Thurs/Fri/Sat   ZINC PO Take by mouth.        Allergies:  Allergies  Allergen Reactions   Cyclobenzaprine  Other (See Comments)    Fainting, dizzy, lightheaded    Family History: Family History  Problem Relation Age of Onset   Hypertension Mother    Heart murmur Mother  Kidney disease Father        stones   Heart disease Father    Hyperlipidemia Father    Cancer Maternal Grandmother    Heart disease Maternal Grandfather    Heart disease Paternal Grandmother    Heart disease Paternal Grandfather     Social History:  reports that he has quit smoking. His smoking use included cigarettes. He has a 15 pack-year smoking history. He has quit using smokeless tobacco. He reports current alcohol use of about 3.0 standard drinks of alcohol per week. He reports that he does not use drugs.   Physical Exam: BP 113/76   Pulse 74   Ht 6' 3 (1.905 m)   Wt 220 lb (99.8 kg)   BMI 27.50 kg/m   Constitutional:  Alert, No acute distress. HEENT:  AT Respiratory: Normal respiratory effort, no  increased work of breathing. GU: Phallus circumcised.  Pedunculated lesion right proximal shaft.  Prostate 35-40 g, smooth without nodules Psychiatric: Normal mood and affect.    Assessment & Plan:    1. Hypogonadism in male (Primary) Stable Repeat testosterone  level was within normal range Lab visit 6 months testosterone , H/H Office visit 1 year testosterone , H/H, PSA  2.  Elevated PSA Benign DRE Repeat PSA persistently elevated above baseline Recommend further evaluation with prostate MRI  3.  Erectile dysfunction Trial tadalafil  20 mg 30-60 minutes prior to sexual activity We also discussed a venous compression may  4.  Penile lesion Does not have the typical appearance of a condyloma Discussed local excision in office and he will think over  5.  BPH with LUTS Alfuzosin  refilled PVR today 208 mL Annual follow-up with PVR   Andrew JAYSON Barba, MD  [facility] [address] [place], [place] [address] [phone]

## 2024-02-12 NOTE — Patient Instructions (Addendum)
 Scheduling number: [phone]   venoseal

## 2024-03-02 ENCOUNTER — Other Ambulatory Visit: Payer: Self-pay

## 2024-03-02 DIAGNOSIS — E079 Disorder of thyroid, unspecified: Secondary | ICD-10-CM

## 2024-03-30 ENCOUNTER — Other Ambulatory Visit: Payer: Self-pay | Admitting: Family Medicine

## 2024-03-30 DIAGNOSIS — E079 Disorder of thyroid, unspecified: Secondary | ICD-10-CM

## 2024-04-15 ENCOUNTER — Ambulatory Visit (INDEPENDENT_AMBULATORY_CARE_PROVIDER_SITE_OTHER): Admitting: Urology

## 2024-04-15 VITALS — BP 130/74 | HR 80 | Ht 72.0 in | Wt 220.0 lb

## 2024-04-15 DIAGNOSIS — N489 Disorder of penis, unspecified: Secondary | ICD-10-CM

## 2024-04-16 ENCOUNTER — Encounter: Payer: Self-pay | Admitting: Urology

## 2024-04-16 NOTE — Progress Notes (Signed)
" ° ° °  Procedure note  Diagnosis: Penile lesion  Procedure: Excision penile lesion  Indications: 59 y.o. male seen 02/12/2024 complaining of a penile lesion.  No symptoms but desires excision  Anesthesia: 1% Xylocaine plain-1 cc  Description: The external genitalia were prepped and draped sterilely.  Midshaft flat dorsal penile lesion measured 3 x 6 mm.  The surrounding skin was anesthetized with 1% plain Xylocaine.  The lesion was then excised completely using a skin ellipse measuring 7 x 10 mm.  Hemostasis was obtained with cautery.  The skin was closed with interrupted 3-0 chromic suture  Complications: None  Plan: Keep site dry x 24 hours.  May shower in 24 hours.  No bath, hot tub or pool x 5 days Sutures will dissolve  Call for increased redness, drainage, increasing pain or other signs of infection No intercourse until sutures dissolve (10-14 days) Will call with pathology results   Glendia Barba, MD  "

## 2024-04-22 LAB — "UROPATH PENIS BIOPSY "

## 2024-04-23 ENCOUNTER — Ambulatory Visit: Payer: Self-pay | Admitting: Urology

## 2024-12-24 ENCOUNTER — Other Ambulatory Visit

## 2024-12-31 ENCOUNTER — Encounter: Admitting: Family Medicine
